# Patient Record
Sex: Female | Born: 1980 | Race: White | Hispanic: No | Marital: Married | Smoking: Never smoker
Health system: Southern US, Community
[De-identification: ages and names within clinical notes are randomized; demographics above are authoritative.]

## PROBLEM LIST (undated history)

## (undated) DIAGNOSIS — K589 Irritable bowel syndrome without diarrhea: Secondary | ICD-10-CM

## (undated) DIAGNOSIS — Z87898 Personal history of other specified conditions: Secondary | ICD-10-CM

## (undated) HISTORY — DX: Personal history of other specified conditions: Z87.898

## (undated) HISTORY — DX: Irritable bowel syndrome, unspecified: K58.9

---

## 1999-03-10 HISTORY — PX: WISDOM TOOTH EXTRACTION: SHX21

## 1999-07-17 ENCOUNTER — Encounter: Admission: RE | Admit: 1999-07-17 | Discharge: 1999-07-17 | Payer: Self-pay | Admitting: Internal Medicine

## 1999-07-17 ENCOUNTER — Encounter: Payer: Self-pay | Admitting: Internal Medicine

## 2008-02-13 ENCOUNTER — Encounter (INDEPENDENT_AMBULATORY_CARE_PROVIDER_SITE_OTHER): Payer: Self-pay | Admitting: Obstetrics and Gynecology

## 2008-02-13 ENCOUNTER — Inpatient Hospital Stay (HOSPITAL_COMMUNITY): Admission: AD | Admit: 2008-02-13 | Discharge: 2008-02-15 | Payer: Self-pay | Admitting: Obstetrics and Gynecology

## 2008-08-14 LAB — HM PAP SMEAR: Pap Smear: NEGATIVE

## 2009-09-12 LAB — HM PAP SMEAR: Pap Smear: NEGATIVE

## 2010-07-22 NOTE — H&P (Signed)
[name], Vazquez              ACCOUNT NO.:  [id number]   MEDICAL RECORD NO.:  [id number]          PATIENT TYPE:  INP   LOCATION:  9167                          FACILITY:  [facility]   PHYSICIAN:  Crist Fat. Rivard, M.D. DATE OF BIRTH:  1980-09-21   DATE OF ADMISSION:  02/13/2008  DATE OF DISCHARGE:                              HISTORY & PHYSICAL   [name] is a 30 year old gravida 2, para 0-0-1-0, at 41 weeks who  presented from the office for induction secondary to oligohydramnios on  ultrasound today.  Patient was seen over the last week to week and a  half with some mildly elevated blood pressures.  PIH labs were within  normal limits last week.  She had a normal NST on Friday.  She was  preferring to decline induction therefore she was scheduled for an  ultrasound today that did show complete anhidrosis with no fluid able to  be evaluated.  BPP was 6/8 with a -2 only for no fluid.  Dopplers were  unable to be done secondary to the inability to clearly visualize the  cord.  There was a cord around the neck noted.  She denied any leaking  or bleeding and reported positive fetal movement.  Cervix in the office  today was 2 to 3, 85% vertex, and a -1 to 0 station.  Pregnancy has been  remarkable for:  1. Post dates.  2. Equivocal rubella.  3. PENICILLIN ALLERGY.  4. First trimester spotting.  5. Mildly elevated blood pressure in the third trimester.  6. Group B strep negative.   PRENATAL LABS:  Blood type is A+.  Rh antibody negative.  VDRL  nonreactive.  Rubella titer is equivocal.  Hepatitis B surface antigen  is negative.  HIV is negative.  First trimester screen was normal.  Cystic fibrosis testing was negative.  GC and chlamydia cultures were  negative in the first trimester.  Pap was normal in April of 2009.  Patient had a normal AFP.  Hemoglobin upon entering the practice was  12.6.  Hemoglobin was 11.4 at 28 weeks.  Glucola was normal.  Group B  strep culture and other  cultures were negative at 36 weeks.   HISTORY OF PRESENT PREGNANCY:  Patient entered care as a transfer from  [facility].  She entered care at [facility] at 24 weeks.  She was hoping for an unmedicated, uninterventive birth.  There was a  note that all her listed allergies were only GI issues except for the  penicillin which was a childhood allergy.  She had a normal Glucola.  Hemoglobin was 11.4 at 28 weeks.  She had a small hemorrhoid noted at 30  weeks.  She declined the H1N1 vaccine.  Group B strep culture was  negative at [redacted] weeks along with other cultures.  She took Elige Radon  classes during her pregnancy.  She had developed a birth plan at 39  weeks.  At 40-2/7 weeks, blood pressure was 124/88, repeat was 120/80.  Cervix was fingertip, 60%, vertex -1.  She was placed on bedrest that  day.  PIH labs  were done.  She had an NST on February 10, 2008, that was  reactive and PIH labs were normal.  At 40-4/7 weeks, she had 1+ protein  on a voided specimen but cath specimen was negative.  She declined  induction of labor.  She was having no PIH symptoms.  She was scheduled  for an ultrasound today.  She was then seen today for ultrasound which  the anhidrosis was noted and cervix was noted be favorable at 2 to 3 cm.   OBSTETRICAL HISTORY:  In 2009, she had a first trimester miscarriage at  6 to 8 weeks which resolved spontaneously.  She is a previous condom  user and oral contraceptive user.  She had an abnormal Pap in 1997.  Repeat Pap was done.  Last one was done in May 2009 and was normal at  [facility].  She was treated for chlamydia in 1995.  She has  frequent UTIs and yeast infections.  She reports usual childhood  illnesses.  She has history of questionable IBS-type symptoms but no  problems currently during her pregnancy.  Surgical history includes  wisdom teeth removed in 2001.   ALLERGIES:  1. PENICILLIN WHICH HAS AN UNKNOWN TYPE OF ALLERGY AS A CHILD.  2.  Z-PAK, CODEINE, AND MYCINS ALL CAUSE NAUSEA AND VOMITING.   FAMILY HISTORY:  Her paternal grandfather had heart disease.  Maternal  grandmother and maternal grandfather had chronic hypertension.  Maternal  grandmother had a stroke.  Paternal grandfather had stomach and lung  cancer.  Maternal grandfather had colon cancer.   GENETIC HISTORY:  Unremarkable.   SOCIAL HISTORY:  Patient is married to the father of the baby.  He is  involved and supportive.  His name is Alyssa Vazquez.  Patient is  Caucasian.  She denies a religious affiliation.  She has a bachelor's  degree.  She is employed in social work.  Her husband has a bachelor's  degree.  He is a Production designer, theatre/television/film.  She has been followed by the certified nurse  midwife service at [facility].  She denies any alcohol, drug,  or tobacco use during this pregnancy.   PHYSICAL EXAM:  Blood pressure initially was 140/90 in the office, here  it is 135/78.  Other vital signs are stable.  HEENT:  Within normal limits.  LUNGS:  The breath sounds are clear.  HEART:  Regular rhythm without murmur.  BREASTS:  Soft, nontender.  ABDOMEN:  Fundal height is approximately 38 cm.  Estimated fetal weight  is 77-1/2 pounds.  Uterine contractions are very occasional and mild.  Fetal heart rate is overall reactive.  There is some very occasional  mild variables noted.  Deep tendon reflexes are 2+ without clonus.  There is a trace edema noted.  Cervix as previously noted was to 2 to 3,  85% vertex, -1 to 0 station in the office.   IMPRESSION:  1. Intrauterine pregnancy at 41 weeks.  2. Oligohydramnios.  3. Favorable cervix.  4. Post dates.  5. Mild labile blood pressures.   PLAN:  1. Admit to birthing suite, consult Dr. Estanislado Pandy who is attending      physician.  2. Routine certified nurse midwife orders.  3. Plan Pitocin induction and artificial rupture of membranes as      appropriate.  Support was offered to the patient for these changes      in  her anticipated birth plan in light of fetal status.  Support  was offered to the patient regarding our willingness to help her      accomplish the components of her birth plan that may still be      accomplishable despite the risk situation.  4. Close observation of maternal fetal status.  5. We will check PIH labs today.      Renaldo Reel Emilee Hero, C.N.M.      Crist Fat Rivard, M.D.  Electronically Signed    VLL/MEDQ  D:  02/13/2008  T:  02/13/2008  Job:  [id number]

## 2010-07-22 NOTE — H&P (Signed)
Alyssa Vazquez, Alyssa Vazquez              ACCOUNT NO.:  [id number]   MEDICAL RECORD NO.:  [id number]          PATIENT TYPE:  INP   LOCATION:  9125                          FACILITY:  [facility]   PHYSICIAN:  Crist Fat. Rivard, M.D. DATE OF BIRTH:  1981/01/28   DATE OF ADMISSION:  02/13/2008  DATE OF DISCHARGE:                              HISTORY & PHYSICAL   This is a 30 year old gravida 2, para 0-0-1-0 at 41 weeks who presents  to the office with postdates.  She had an ultrasound at the office,  which showed no fluid anywhere in the uterus and the growth of the baby  was 70.2 percentile.  Fetal heart rate was 144, and the placenta was  grade 3.  Doppler studies were unable to be obtained due to absence of  amniotic fluid.  She is being sent to the hospital for induction of  labor.  Pregnancy has been followed by the Nurse-Midwife service and  remarkable for equivocal rubella, first trimester spotting, penicillin  allergy, and IBS.   ALLERGIES:  PENICILLIN, Z-PAK, CODEINE, and MYCIN DRUGS.   OB HISTORY:  Remarkable for a spontaneous abortion at 6th week in 2009.   MEDICAL HISTORY:  Remarkable for abnormal Pap in 1997, Chlamydia in  1995, childhood varicella and irritable bowel syndrome.   SURGICAL HISTORY:  Remarkable for wisdom teeth in 2001.   FAMILY HISTORY:  Remarkable for grandmother with heart disease and  hypertension.  Grandmother with unknown type of neurologic problem and  grandmother and grandfather with cancer.   GENETIC HISTORY:  Negative.   SOCIAL HISTORY:  The patient is married to [employer], who is  involved and supportive.  She is a Child psychotherapist.  She denies any  alcohol, tobacco, or drug use.   PRENATAL LABS:  Hemoglobin 12.2, platelets 213, blood type A positive.  Antibody screen negative.  Rubella equivocal.  Hepatitis negative.  HIV  negative.  Pap test normal.  Gonorrhea negative.  Chlamydia negative.  Cystic fibrosis negative.   HISTORY OF CURRENT PREGNANCY:   The patient entered care at 24 weeks as a  transfer from [facility].  She had Glucola at 28 weeks that was  normal, treated for hemorrhoids at 30 weeks.  Group B strep was negative  at term.  She did take Elige Radon classes and then she presented today with  oligohydramnios.  She is very reluctant to pursue induction of labor but  agrees to proceed.   OBJECTIVE DATA:  VITAL SIGNS:  Stable, afebrile.  HEENT:  Within normal limits. Thyroid normal, not enlarged.  CHEST:  Clear to auscultation.  HEART:  Heart rate, regular rate and rhythm.  ABDOMEN:  Gravid.  Fetal heart rate is 144.  PELVIC:  Cervix is 2-3, 85 -1 station with vertex presentation.  EXTREMITIES:  Within normal limits.   Ultrasound shows growth at 70.2%.  Oligohydramnios with no fluid seen.  Placenta is posterior grade 3, and BPP is 6/8 with zero points for  fluid.   ASSESSMENT:  1. Intrauterine pregnancy at 41 weeks.  2. Oligohydramnios, severe.   PLAN:  Admit to [facility]  for induction of labor.  Risks and  benefits reviewed.  The patient is reluctant to pursue induction because  of desire to complete a Elige Radon method birth.  However, reassurances  were made and risks of not inducing labor were reviewed and the patient  agrees to proceed and further orders to follow.      Marie L. Williams, C.N.M.      Crist Fat Rivard, M.D.  Electronically Signed    MLW/MEDQ  D:  02/13/2008  T:  02/14/2008  Job:  [id number]

## 2010-09-12 ENCOUNTER — Inpatient Hospital Stay (HOSPITAL_COMMUNITY)
Admission: AD | Admit: 2010-09-12 | Discharge: 2010-09-13 | DRG: 775 | Disposition: A | Discharge status: Home / Self Care | Payer: 59 | Source: Ambulatory Visit | Attending: Obstetrics and Gynecology | Admitting: Obstetrics and Gynecology

## 2010-09-12 DIAGNOSIS — Z3490 Encounter for supervision of normal pregnancy, unspecified, unspecified trimester: Secondary | ICD-10-CM

## 2010-09-12 DIAGNOSIS — O480 Post-term pregnancy: Principal | ICD-10-CM | POA: Diagnosis present

## 2010-09-12 MED ORDER — OXYTOCIN 20 UNITS IN LACTATED RINGERS INFUSION - SIMPLE: 125.0000 mL/h | INTRAVENOUS | Status: DC | PRN

## 2010-09-12 MED ORDER — IBUPROFEN 600 MG PO TABS
600.0000 mg | ORAL_TABLET | Freq: Four times a day (QID) | ORAL | Status: DC
  Administered 2010-09-13 (×2): 600 mg via ORAL
  Filled 2010-09-12 (×3): qty 1

## 2010-09-12 MED ORDER — ONDANSETRON HCL 4 MG PO TABS: 4.0000 mg | ORAL_TABLET | ORAL | Status: DC | PRN

## 2010-09-12 MED ORDER — METHYLERGONOVINE MALEATE 0.2 MG/ML IJ SOLN: 0.2000 mg | Freq: Four times a day (QID) | INTRAMUSCULAR | Status: DC | PRN

## 2010-09-12 MED ORDER — METHYLERGONOVINE MALEATE 0.2 MG PO TABS: 0.2000 mg | ORAL_TABLET | ORAL | Status: DC | PRN

## 2010-09-12 MED ORDER — OXYCODONE-ACETAMINOPHEN 5-325 MG PO TABS
1.0000 | ORAL_TABLET | ORAL | Status: DC | PRN
  Administered 2010-09-13 (×2): 1 via ORAL
  Filled 2010-09-12 (×2): qty 1

## 2010-09-12 MED ORDER — BISACODYL 10 MG RE SUPP: 10.0000 mg | Freq: Every day | RECTAL | Status: DC | PRN

## 2010-09-12 MED ORDER — OXYTOCIN 20 UNITS IN LACTATED RINGERS INFUSION - SIMPLE: 125.0000 mL/h | INTRAVENOUS | Status: DC

## 2010-09-12 MED ORDER — DIPHENHYDRAMINE HCL 25 MG PO CAPS: 25.0000 mg | ORAL_CAPSULE | Freq: Four times a day (QID) | ORAL | Status: DC | PRN

## 2010-09-12 MED ORDER — FERROUS SULFATE 325 (65 FE) MG PO TABS
325.0000 mg | ORAL_TABLET | Freq: Two times a day (BID) | ORAL | Status: DC
  Administered 2010-09-13: 325 mg via ORAL

## 2010-09-12 MED ORDER — MAGNESIUM HYDROXIDE 400 MG/5ML PO SUSP: 30.0000 mL | ORAL | Status: DC | PRN

## 2010-09-12 MED ORDER — SENNOSIDES-DOCUSATE SODIUM 8.6-50 MG PO TABS
1.0000 | ORAL_TABLET | Freq: Every evening | ORAL | Status: DC | PRN
  Filled 2010-09-12: qty 2

## 2010-09-12 MED ORDER — WITCH HAZEL-GLYCERIN EX PADS
MEDICATED_PAD | CUTANEOUS | Status: DC | PRN
  Filled 2010-09-12: qty 100

## 2010-09-12 MED ORDER — ZOLPIDEM TARTRATE 5 MG PO TABS: 5.0000 mg | ORAL_TABLET | Freq: Every evening | ORAL | Status: DC | PRN

## 2010-09-12 MED ORDER — PSEUDOEPHEDRINE HCL 30 MG PO TABS: 60.0000 mg | ORAL_TABLET | ORAL | Status: DC | PRN

## 2010-09-12 MED ORDER — MENTHOL 3 MG MT LOZG: 1.0000 | LOZENGE | OROMUCOSAL | Status: DC | PRN

## 2010-09-12 MED ORDER — SODIUM CHLORIDE 0.9 % IJ SOLN
3.0000 mL | Freq: Two times a day (BID) | INTRAMUSCULAR | Status: DC
  Filled 2010-09-12 (×3): qty 3

## 2010-09-12 MED ORDER — GUAIFENESIN 100 MG/5ML PO SOLN: 15.0000 mL | ORAL | Status: DC | PRN

## 2010-09-12 MED ORDER — ONDANSETRON HCL 4 MG/2ML IJ SOLN: 4.0000 mg | INTRAMUSCULAR | Status: DC | PRN

## 2010-09-12 MED ORDER — SIMETHICONE 80 MG PO CHEW
80.0000 mg | CHEWABLE_TABLET | ORAL | Status: DC | PRN
  Filled 2010-09-12: qty 1

## 2010-09-12 MED ORDER — CALCIUM CARBONATE ANTACID 500 MG PO CHEW: 1.0000 | CHEWABLE_TABLET | ORAL | Status: DC | PRN

## 2010-09-12 MED ORDER — BENZOCAINE-MENTHOL 20-0.5 % EX AERO: 1.0000 "application " | INHALATION_SPRAY | CUTANEOUS | Status: DC | PRN

## 2010-09-13 DIAGNOSIS — Z3490 Encounter for supervision of normal pregnancy, unspecified, unspecified trimester: Secondary | ICD-10-CM

## 2010-09-13 LAB — CBC
HCT: 34.7 % — ABNORMAL LOW (ref 36.0–46.0)
Hemoglobin: 11.5 g/dL — ABNORMAL LOW (ref 12.0–15.0)
RBC: 3.98 MIL/uL (ref 3.87–5.11)
WBC: 14.1 10*3/uL — ABNORMAL HIGH (ref 4.0–10.5)

## 2010-09-13 MED ORDER — IBUPROFEN 600 MG PO TABS: 600.0000 mg | ORAL_TABLET | Freq: Four times a day (QID) | ORAL | Status: AC

## 2010-09-13 MED ORDER — OXYCODONE-ACETAMINOPHEN 5-325 MG PO TABS: 1.0000 | ORAL_TABLET | ORAL | Status: AC | PRN

## 2010-09-13 NOTE — Discharge Summary (Signed)
Obstetric Discharge Summary Reason for Admission: labor Prenatal Procedures: none Intrapartum Procedures: spontaneous vaginal delivery Postpartum Procedures: none Complications-Operative and Postpartum: none  Hemoglobin  Date Value Range Status  09/13/2010 11.5* 12.0-15.0 (g/dL) Final     HCT  Date Value Range Status  09/13/2010 34.7* 36.0-46.0 (%) Final    Discharge Diagnoses: Term Pregnancy-delivered  Discharge Information: Date: 09/13/2010 Activity: pelvic rest Diet: routine Medications: PNV, Ibuprophen and Percocet Condition: stable Instructions: refer to practice specific booklet Discharge to: home Follow-up Information    Follow up with Arlenis Blaydes A. Make an appointment in 6 weeks.   Contact information:   [address], [address] [place] [address] [phone]          Newborn Data: Live born  Information for the patient's newborn:  Amiri, Tritch [[id number]]  female ; APGAR , ; weight ;  Home with mother.  LILLARD,SHELLEY M 09/13/2010, 12:56 PM

## 2010-09-13 NOTE — Progress Notes (Deleted)
Obstetric Discharge Summary Reason for Admission: onset of labor Prenatal Procedures: none Intrapartum Procedures: spontaneous vaginal delivery Postpartum Procedures: none Complications-Operative and Postpartum: none  Hemoglobin  Date Value Range Status  09/13/2010 11.5* 12.0-15.0 (g/dL) Final     HCT  Date Value Range Status  09/13/2010 34.7* 36.0-46.0 (%) Final    Discharge Diagnoses: Term Pregnancy-delivered  Discharge Information: Date: 09/13/2010 Activity: pelvic rest Diet: routine Medications: PNV, Ibuprophen and Percocet Condition: stable Instructions: refer to practice specific booklet Discharge to: home Follow-up Information    Follow up with RIVARD,SANDRA A. Make an appointment in 6 weeks.   Contact information:   [address], [address] [place] [address] [phone]          Newborn Data: Live born  Information for the patient's newborn:  Alyssa Vazquez, Alyssa Vazquez [[id number]]  female ; APGAR , ; weight ;  Home with mother.  Alyssa Vazquez 09/13/2010, 12:50 PM

## 2010-09-13 NOTE — Progress Notes (Signed)
Pt discharged at 2000 

## 2010-09-13 NOTE — Progress Notes (Signed)
  Post Partum Day 1 Subjective: no complaints, up ad lib, voiding and tolerating PO  Objective: Blood pressure 113/73, pulse 85, temperature 97.8 F (36.6 C), resp. rate 20, height 5\' 3"  (1.6 m), weight 87.544 kg (193 lb).  Physical Exam:  General: alert and no distress Lochia: appropriate Uterine Fundus: firm Incision: none DVT Evaluation: No evidence of DVT seen on physical exam.   Basename 09/13/10 0520  HGB 11.5*  HCT 34.7*    Assessment/Plan: Discharge home, Breastfeeding, Circumcision prior to discharge and Contraception condoms Pt desires early DC, after 1800.     LOS: 1 day   Guenther Dunshee M 09/13/2010, 12:52 PM

## 2010-12-12 LAB — CBC
HCT: 36 % (ref 36.0–46.0)
Hemoglobin: 11.6 g/dL — ABNORMAL LOW (ref 12.0–15.0)
MCHC: 33.5 g/dL (ref 30.0–36.0)
MCV: 89.3 fL (ref 78.0–100.0)
Platelets: 170 10*3/uL (ref 150–400)
RBC: 3.72 MIL/uL — ABNORMAL LOW (ref 3.87–5.11)
RDW: 16.5 % — ABNORMAL HIGH (ref 11.5–15.5)
RDW: 17 % — ABNORMAL HIGH (ref 11.5–15.5)
WBC: 15.5 10*3/uL — ABNORMAL HIGH (ref 4.0–10.5)

## 2010-12-12 LAB — COMPREHENSIVE METABOLIC PANEL
ALT: 17 U/L (ref 0–35)
Albumin: 2.8 g/dL — ABNORMAL LOW (ref 3.5–5.2)
Alkaline Phosphatase: 130 U/L — ABNORMAL HIGH (ref 39–117)
BUN: 8 mg/dL (ref 6–23)
Calcium: 8.9 mg/dL (ref 8.4–10.5)
Chloride: 109 mEq/L (ref 96–112)
Creatinine, Ser: 0.58 mg/dL (ref 0.4–1.2)
GFR calc Af Amer: >60 mL/min (ref >60)
Glucose, Bld: 102 mg/dL — ABNORMAL HIGH (ref 70–99)
Potassium: 3.8 mEq/L (ref 3.5–5.1)
Sodium: 140 mEq/L (ref 135–145)
Total Bilirubin: 0.6 mg/dL (ref 0.3–1.2)
Total Protein: 5.1 g/dL — ABNORMAL LOW (ref 6.0–8.3)
Total Protein: 6 g/dL (ref 6.0–8.3)

## 2010-12-12 LAB — LACTATE DEHYDROGENASE: LDH: 171 U/L (ref 94–250)

## 2010-12-12 LAB — RPR: RPR Ser Ql: NONREACTIVE

## 2010-12-12 LAB — URIC ACID: Uric Acid, Serum: 5.5 mg/dL (ref 2.4–7.0)

## 2012-01-01 ENCOUNTER — Telehealth: Payer: Self-pay | Admitting: Obstetrics and Gynecology

## 2012-01-04 ENCOUNTER — Encounter: Payer: PRIVATE HEALTH INSURANCE | Admitting: Obstetrics and Gynecology

## 2012-02-17 ENCOUNTER — Ambulatory Visit (INDEPENDENT_AMBULATORY_CARE_PROVIDER_SITE_OTHER): Payer: PRIVATE HEALTH INSURANCE

## 2012-02-17 VITALS — BP 100/68 | Resp 14 | Wt 158.0 lb

## 2012-02-17 DIAGNOSIS — Z8742 Personal history of other diseases of the female genital tract: Secondary | ICD-10-CM

## 2012-02-17 DIAGNOSIS — Z87898 Personal history of other specified conditions: Secondary | ICD-10-CM

## 2012-02-17 DIAGNOSIS — Z124 Encounter for screening for malignant neoplasm of cervix: Secondary | ICD-10-CM

## 2012-02-17 DIAGNOSIS — Z8719 Personal history of other diseases of the digestive system: Secondary | ICD-10-CM

## 2012-02-17 HISTORY — DX: Personal history of other specified conditions: Z87.898

## 2012-02-17 NOTE — Progress Notes (Signed)
The patient reports:she complains of ParaGard causing irregular menses; however, she doesn't want it out. Contraception:IUD  Last mammogram: Never  Last pap: October 24, 2010 WNL  GC/Chlamydia cultures offered: declined HIV/RPR/HbsAg offered:  declined HSV 1 and 2 glycoprotein offered: declined  Menstrual cycle regular and monthly: no Menstrual flow normal: Yes  Urinary symptoms: none Normal bowel movements: Yes Reports abuse at home: No

## 2012-02-18 LAB — PAP IG W/ RFLX HPV ASCU

## 2012-10-31 NOTE — Progress Notes (Signed)
.. Subjective:    Alyssa Vazquez is a 32 y.o. MW female, P2012, who presents for an annual exam.   The patient reports:she complains of ParaGard causing irregular menses; however, she doesn't want it out. Contraception:IUD  Last mammogram: Never  Last pap: October 24, 2010 WNL  GC/Chlamydia cultures offered: declined  HIV/RPR/HbsAg offered: declined  HSV 1 and 2 glycoprotein offered: declined  Menstrual cycle regular and monthly: no  Menstrual flow normal: Yes  Urinary symptoms: none  Normal bowel movements: Yes  Reports abuse at home: No   History   Social History  . Marital Status: Married    Spouse Name: N/A    Number of Children: N/A  . Years of Education: N/A   Social History Main Topics  . Smoking status: Never Smoker   . Smokeless tobacco: Never Used  . Alcohol Use: 0.5 oz/week    1 drink(s) per week  . Drug Use: No  . Sexual Activity: Yes    Partners: Male    Birth Control/ Protection: IUD     Comment: Paraguard    Other Topics Concern  . None   Social History Narrative  . None   Menstrual cycle:   LMP: Patient's last menstrual period was 02/06/2012. .. Past Medical History  Diagnosis Date  . IBS (irritable bowel syndrome)   . History of abnormal Pap smear 02/17/2012    Abnormal in 1997; repeats have been normal  .. Past Surgical History  Procedure Laterality Date  . Wisdom tooth extraction  2001  .Marland Kitchen Allergies  Allergen Reactions  . Codeine Nausea And Vomiting  . Erythromycin   . Penicillins   . Zithromax [Azithromycin] Nausea Only              The following portions of the patient's history were reviewed and updated as appropriate: allergies, current medications, past family history, past medical history, past social history, past surgical history and problem list.  Review of Systems Pertinent items are noted in HPI. Breast:Negative for breast lump,nipple discharge or nipple retraction Gastrointestinal: Negative for abdominal pain, change  in bowel habits or rectal bleeding Urinary:negative   Objective:    BP 100/68  Resp 14  Wt 158 lb (71.668 kg)  BMI 28 kg/m2  LMP 02/06/2012    Weight:  Wt Readings from Last 1 Encounters:  02/17/12 158 lb (71.668 kg)          BMI: Body mass index is 28 kg/(m^2).  General Appearance: Alert, appropriate appearance for age. No acute distress HEENT: Grossly normal Neck / Thyroid: Supple, no masses, nodes or enlargement Lungs: clear to auscultation bilaterally Back: No CVA tenderness Breast Exam: No dimpling, nipple retraction or discharge. No masses or nodes. and No masses or nodes.No dimpling, nipple retraction or discharge. Cardiovascular: Regular rate and rhythm. S1, S2, no murmur Gastrointestinal: Soft, non-tender, no masses or organomegaly Pelvic Exam: Vulva and vagina appear normal. Bimanual exam reveals normal uterus and adnexa. Paraguard strings visualized. Rectovaginal: not indicated Lymphatic Exam: Non-palpable nodes in neck, clavicular, axillary,  Skin: no rash or abnormalities Neurologic: Normal gait and speech, no tremor  Psychiatric: Alert and oriented, appropriate affect.   Wet Prep:not applicable Urinalysis:not applicable UPT: Not done   Assessment:    Normal 31y.o. gyn exam   irregular menses w/ paraguard-but doesn't desire change in contraception H/o abnl pap IBS Plan:    Mammogram-due age 13 pap smear done, next pap due 2014-2015 return annually or prn STD screening: declined Contraception:IUD--paraguard  C. Skeet Simmer  Alyssa Vazquez, CNM Late entry from 02/17/12

## 2013-01-17 ENCOUNTER — Ambulatory Visit: Payer: PRIVATE HEALTH INSURANCE | Attending: Internal Medicine | Admitting: Physical Therapy

## 2013-01-17 DIAGNOSIS — M2420 Disorder of ligament, unspecified site: Secondary | ICD-10-CM | POA: Insufficient documentation

## 2013-01-17 DIAGNOSIS — M629 Disorder of muscle, unspecified: Secondary | ICD-10-CM | POA: Insufficient documentation

## 2013-01-17 DIAGNOSIS — IMO0001 Reserved for inherently not codable concepts without codable children: Secondary | ICD-10-CM | POA: Insufficient documentation

## 2013-01-25 ENCOUNTER — Ambulatory Visit: Payer: PRIVATE HEALTH INSURANCE | Admitting: Physical Therapy

## 2013-02-07 ENCOUNTER — Ambulatory Visit: Payer: PRIVATE HEALTH INSURANCE | Attending: Internal Medicine | Admitting: Physical Therapy

## 2013-02-07 DIAGNOSIS — M2420 Disorder of ligament, unspecified site: Secondary | ICD-10-CM | POA: Insufficient documentation

## 2013-02-07 DIAGNOSIS — IMO0001 Reserved for inherently not codable concepts without codable children: Secondary | ICD-10-CM | POA: Insufficient documentation

## 2013-02-07 DIAGNOSIS — M629 Disorder of muscle, unspecified: Secondary | ICD-10-CM | POA: Insufficient documentation

## 2013-02-13 ENCOUNTER — Ambulatory Visit: Payer: PRIVATE HEALTH INSURANCE | Admitting: Physical Therapy

## 2013-02-20 ENCOUNTER — Ambulatory Visit: Payer: PRIVATE HEALTH INSURANCE | Admitting: Physical Therapy

## 2013-02-22 ENCOUNTER — Encounter: Payer: PRIVATE HEALTH INSURANCE | Admitting: Physical Therapy

## 2013-05-08 ENCOUNTER — Ambulatory Visit
Admission: RE | Admit: 2013-05-08 | Discharge: 2013-05-08 | Disposition: A | Discharge status: Home / Self Care | Payer: PRIVATE HEALTH INSURANCE | Source: Ambulatory Visit | Attending: Internal Medicine | Admitting: Internal Medicine

## 2013-05-08 ENCOUNTER — Other Ambulatory Visit: Payer: Self-pay | Admitting: Internal Medicine

## 2013-05-08 DIAGNOSIS — R52 Pain, unspecified: Secondary | ICD-10-CM

## 2013-05-17 ENCOUNTER — Ambulatory Visit: Payer: PRIVATE HEALTH INSURANCE

## 2013-09-29 ENCOUNTER — Other Ambulatory Visit: Payer: Self-pay | Admitting: Obstetrics and Gynecology

## 2013-09-29 DIAGNOSIS — N6001 Solitary cyst of right breast: Secondary | ICD-10-CM

## 2013-09-29 DIAGNOSIS — R922 Inconclusive mammogram: Secondary | ICD-10-CM

## 2013-10-05 ENCOUNTER — Ambulatory Visit
Admission: RE | Admit: 2013-10-05 | Discharge: 2013-10-05 | Disposition: A | Discharge status: Home / Self Care | Payer: PRIVATE HEALTH INSURANCE | Source: Ambulatory Visit | Attending: Obstetrics and Gynecology | Admitting: Obstetrics and Gynecology

## 2013-10-05 ENCOUNTER — Other Ambulatory Visit: Payer: Self-pay | Admitting: Obstetrics and Gynecology

## 2013-10-05 DIAGNOSIS — N644 Mastodynia: Secondary | ICD-10-CM

## 2013-10-05 DIAGNOSIS — N6001 Solitary cyst of right breast: Secondary | ICD-10-CM

## 2014-10-02 ENCOUNTER — Ambulatory Visit: Payer: PRIVATE HEALTH INSURANCE | Admitting: Physical Therapy

## 2014-10-22 ENCOUNTER — Ambulatory Visit: Payer: PRIVATE HEALTH INSURANCE | Admitting: Physical Therapy

## 2015-03-28 ENCOUNTER — Other Ambulatory Visit: Payer: Self-pay | Admitting: Internal Medicine

## 2015-03-28 DIAGNOSIS — R945 Abnormal results of liver function studies: Secondary | ICD-10-CM

## 2015-03-28 DIAGNOSIS — R7989 Other specified abnormal findings of blood chemistry: Secondary | ICD-10-CM

## 2015-03-28 DIAGNOSIS — R112 Nausea with vomiting, unspecified: Secondary | ICD-10-CM

## 2015-04-04 ENCOUNTER — Ambulatory Visit
Admission: RE | Admit: 2015-04-04 | Discharge: 2015-04-04 | Disposition: A | Discharge status: Home / Self Care | Payer: PRIVATE HEALTH INSURANCE | Source: Ambulatory Visit | Attending: Internal Medicine | Admitting: Internal Medicine

## 2015-04-04 DIAGNOSIS — R112 Nausea with vomiting, unspecified: Secondary | ICD-10-CM

## 2015-04-04 DIAGNOSIS — R945 Abnormal results of liver function studies: Secondary | ICD-10-CM

## 2015-04-04 DIAGNOSIS — R7989 Other specified abnormal findings of blood chemistry: Secondary | ICD-10-CM

## 2015-11-27 ENCOUNTER — Ambulatory Visit: Payer: PRIVATE HEALTH INSURANCE | Attending: Internal Medicine | Admitting: Physical Therapy

## 2015-11-27 ENCOUNTER — Encounter: Payer: Self-pay | Admitting: Physical Therapy

## 2015-11-27 DIAGNOSIS — R279 Unspecified lack of coordination: Secondary | ICD-10-CM | POA: Diagnosis present

## 2015-11-27 DIAGNOSIS — M6281 Muscle weakness (generalized): Secondary | ICD-10-CM | POA: Diagnosis not present

## 2015-11-27 NOTE — Patient Instructions (Signed)
Lower abdominal/core stability exercises  1. Practice your breathing technique: Inhale through your nose expanding your belly and rib cage. Try not to breathe into your chest. Exhale slowly and gradually out your mouth feeling a sense of softness to your body. Practice multiple times. This can be performed unlimited.  2. Finding the lower abdominals. Laying on your back with the knees bent, place your fingers just below your belly button. Using your breathing technique from above, on your exhale gently pull the belly button away from your fingertips without tensing any other muscles. Practice this 5x. Next, as you exhale, draw belly button inwards and hold onto it...then feel as if you are pulling that muscle across your pelvis like you are tightening a belt. This can be hard to do at first so be patient and practice. Do 5-10 reps 1-3 x day. Always recognize quality over quantity; if your abdominal muscles become tired you will notice you may tighten/contract other muscles. This is the time to take a break.   Practice this first laying on your back, then in sitting, progressing to standing and finally adding it to all your daily movements.   3. Finding your pelvic floor. Using the breathing technique above, when your exhale, this time draw your pelvic floor muscles up as if you were attempting to stop the flow of urination. Be careful NOT to tense any other muscles. This can be hard, BE PATIENT. Try to hold up to 10 seconds repeating 10x. Try 2x a day. Once you feel you are doing this well, add this contraction to exercise #2. First contracting your pelvic floor followed by lower abdominals.   4. Adding leg movements. Add the following leg movements to challenge your ability to keep your core stable:  1. Single leg drop outs: Laying on your back with knees bent feet flat. Inhale,  dropping one knee outward KEEPING YOUR PELVIS STILL. Exhale as you bring the leg back, simultaneously performing your lower  abdominal contraction. Do 5-10 on each leg.   2. Marching: While keeping your pelvis still, lift the right foot a few inches, put it down then lift left foot. This will mimic a march. Start slow to establish control. Once you have control you may speed it up. Do 10-20x. You MUST keep your lower abdominlas contracted while you march. Breathe naturally    3. Single leg slides: Inhale while you slowly slide one leg out keeping your pelvis still. Only slide your leg as far as you can keep your pelvis still. Exhale as you bring the leg back to the start, contracting the lower abdominals as you do that. Keep your upper body relaxed. Do 5-10 on each side.    [facility] [address], [address] [place], [place] [address] Phone # [phone] Fax [phone]       
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
## 2015-12-05 ENCOUNTER — Ambulatory Visit: Payer: PRIVATE HEALTH INSURANCE | Admitting: Physical Therapy

## 2015-12-05 ENCOUNTER — Encounter: Payer: Self-pay | Admitting: Physical Therapy

## 2015-12-05 DIAGNOSIS — R279 Unspecified lack of coordination: Secondary | ICD-10-CM

## 2015-12-05 DIAGNOSIS — M6281 Muscle weakness (generalized): Secondary | ICD-10-CM | POA: Diagnosis not present

## 2015-12-05 NOTE — Patient Instructions (Addendum)
Piriformis Stretch, Sitting    Sit, one ankle on opposite knee, same-side hand on crossed knee. Push down on knee, keeping spine straight. Lean torso forward, with flat back, until tension is felt in hamstrings and gluteals of crossed-leg side. Hold _30__ seconds.  Repeat _2__ times per session. Do _1__ sessions per day.  Copyright  VHI. All rights reserved.  Chair Sitting    Sit at edge of seat, spine straight, one leg extended. Put a hand on each thigh and bend forward from the hip, keeping spine straight. Allow hand on extended leg to reach toward toes. Support upper body with other arm. Hold _30__ seconds. Repeat __2_ times per session. Do _1__ sessions per day.  Copyright  VHI. All rights reserved.   Adductor, Sitting    Sit with legs apart. Slide hands forward. Hold _30__ seconds. Repeat _2__ times per session. Do __1_ sessions per day.  Copyright  VHI. All rights reserved.  Quads / HF, Standing    Stand, holding onto chair and grasping one foot with same-side hand. Pull heel toward buttock. Hold _30__ seconds.  Repeat _2__ times per session. Do _1__ sessions per day.  Copyright  VHI. All rights reserved.  Bracing With Knee Fallout (Hook-Lying)    With neutral spine, tighten pelvic floor and abdominals and hold. Alternating legs, drop knee out to side. Keep opposite hip still. Repeat 10___ times. Do _1__ times a day.   Copyright  VHI. All rights reserved.  [facility] [address], [address] [place], [place] [address] Phone # [phone] Fax [phone]

## 2015-12-05 NOTE — Therapy (Signed)
[facility] [address], [place], [place], [address] Phone: [phone]   Fax:  [phone]  Physical Therapy Treatment  Patient Details  Name: Alyssa Vazquez MRN: [id number] Date of Birth: 1980/09/14 Referring Provider: Dr. Jilda Panda  Encounter Date: 12/05/2015      PT End of Session - 12/05/15 1237    Visit Number 2   Date for PT Re-Evaluation 01/22/16   PT Start Time 1236  patient came late   PT Stop Time 1315   PT Time Calculation (min) 39 min   Activity Tolerance Patient tolerated treatment well   Behavior During Therapy [facility] for tasks assessed/performed      Past Medical History:  Diagnosis Date  . History of abnormal Pap smear 02/17/2012   Abnormal in 1997; repeats have been normal  . IBS (irritable bowel syndrome)     Past Surgical History:  Procedure Laterality Date  . WISDOM TOOTH EXTRACTION  2001    There were no vitals filed for this visit.      Subjective Assessment - 12/05/15 1237    Subjective I have not had any accidents since last visit.  Patient finds herself think about the pelvic floor contraction when laughing.    Patient Stated Goals No longer feel like she has to urinate; no urinary leakage; rebuild core strength   Currently in Pain? No/denies                         [facility] Adult PT Treatment/Exercise - 12/05/15 0001      Exercises   Exercises Lumbar     Lumbar Exercises: Supine   Ab Set 2 seconds;20 reps  tactile cues to increase lower abdominal contraction     Manual Therapy   Manual Therapy Joint mobilization;Soft tissue mobilization;Muscle Energy Technique   Manual therapy comments manually stretched bil. piriformis and posterior hip capsule   Joint Mobilization P-A rotational mobilizaton grade 3; sacral mobilization for rotational movement   Soft tissue mobilization bil. levator ani in prone                PT Education - 12/05/15 1304     Education provided Yes   Education Details core strength; stretches   Person(s) Educated Patient   Methods Explanation;Demonstration;Handout;Tactile cues   Comprehension Returned demonstration;Verbalized understanding          PT Short Term Goals - 12/05/15 1311      PT SHORT TERM GOAL #1   Title independent with initial HEP   Time 4   Period Weeks   Status On-going     PT SHORT TERM GOAL #2   Title urinary leakage with laughing decreased >/= 25%   Time 4   Period Weeks   Status On-going           PT Long Term Goals - 11/27/15 1528      PT LONG TERM GOAL #1   Title independent with HEP   Time 8   Period Weeks   Status New     PT LONG TERM GOAL #2   Title urinary leakage with laughing decreased >/= 80%   Time 8   Period Weeks   Status New     PT LONG TERM GOAL #3   Title abdominal strength >/= 4/5 so she is able to do daily tasks with greater ease   Time 8   Period Weeks   Status New  PT LONG TERM GOAL #4   Title pain in right buttocks decreased >/= 75%   Time 8   Period Weeks   Status New               Plan - 12/05/15 1312    Clinical Impression Statement Patient has not met her goals due to just starting therapy.  Patient has not had urinary leakage since her last visit.  Right ilium is rotated anterior and corrected after visit.  Patient needed many tactile cues to contract abdominals correctly.  Patient had decreased mobility fo the L1-L5. Patient will benefit from skilled therapy to improve strength to reduce urinary leakage.    Rehab Potential Excellent   Clinical Impairments Affecting Rehab Potential None   PT Frequency 1x / week   PT Duration 8 weeks   PT Treatment/Interventions Biofeedback;Electrical Stimulation;Cryotherapy;Ultrasound;Moist Heat;Therapeutic activities;Therapeutic exercise;Neuromuscular re-education;Patient/family education;Manual techniques   PT Next Visit Plan core strengthening with pelvic floor contraction; check  on pelvic alignment; work on abdominal bracing   PT Home Exercise Plan progress as needed   Consulted and Agree with Plan of Care Patient      Patient will benefit from skilled therapeutic intervention in order to improve the following deficits and impairments:  Decreased mobility, Decreased endurance, Increased fascial restricitons, Increased muscle spasms  Visit Diagnosis: Muscle weakness (generalized)  Unspecified lack of coordination     Problem List Patient Active Problem List   Diagnosis Date Noted  . History of abnormal Pap smear 02/17/2012  . History of IBS 02/17/2012  . Normal pregnancy 09/13/2010    Earlie Counts, PT 12/05/15 1:15 PM   [facility] [address], [place], [place], [address] Phone: [phone]   Fax:  [phone]  Name: BLENDA WISECUP MRN: [id number] Date of Birth: May 21, 1980

## 2015-12-12 ENCOUNTER — Encounter: Payer: Self-pay | Admitting: Physical Therapy

## 2015-12-12 ENCOUNTER — Ambulatory Visit: Payer: PRIVATE HEALTH INSURANCE | Attending: Internal Medicine | Admitting: Physical Therapy

## 2015-12-12 DIAGNOSIS — R279 Unspecified lack of coordination: Secondary | ICD-10-CM | POA: Insufficient documentation

## 2015-12-12 DIAGNOSIS — M6281 Muscle weakness (generalized): Secondary | ICD-10-CM | POA: Insufficient documentation

## 2015-12-12 NOTE — Patient Instructions (Addendum)
Quick Contraction: Gravity Resisted (Sitting)    Sitting, quickly squeeze then fully relax pelvic floor. Perform _1__ sets of 5___. Rest for _1__ seconds between sets. Do _3__ times a day.  Copyright  VHI. All rights reserved.  Slow Contraction: Gravity Resisted (Sitting)    Sitting, slowly squeeze pelvic floor for _4_ seconds. Rest for __5_ seconds. Repeat _10__ times. Do _3__ times a day.  Copyright  VHI. All rights reserved.  Cough: Phase 2 (Sitting)    Sit. Squeeze pelvic floor and hold. Inhale. Lean shoulders forward. Exhale. Relax. Repeat _5__ times. Do _3__ times a day.  Copyright  VHI. All rights reserved.  [facility] [address], [address] [place], [place] [address] Phone # [phone] Fax [phone]

## 2015-12-12 NOTE — Therapy (Signed)
[facility] [address], [address] [place], [place], [address] Phone: [phone]   Fax:  [phone]  Physical Therapy Treatment  Patient Details  Name: Alyssa Vazquez MRN: [id number] Date of Birth: Apr 26, 1980 Referring Provider: Dr. Ralene Ok  Encounter Date: 12/12/2015      PT End of Session - 12/12/15 1535    Visit Number 3   Date for PT Re-Evaluation 01/22/16   PT Start Time 1532   PT Stop Time 1610   PT Time Calculation (min) 38 min   Activity Tolerance Patient tolerated treatment well   Behavior During Therapy [facility] for tasks assessed/performed      Past Medical History:  Diagnosis Date  . History of abnormal Pap smear 02/17/2012   Abnormal in 1997; repeats have been normal  . IBS (irritable bowel syndrome)     Past Surgical History:  Procedure Laterality Date  . WISDOM TOOTH EXTRACTION  2001    There were no vitals filed for this visit.      Subjective Assessment - 12/12/15 1533    Subjective I went to the trampoline park. I went to the bathroom 4 times and still leaked. I do not in July.    Patient Stated Goals No longer feel like she has to urinate; no urinary leakage; rebuild core strength   Currently in Pain? No/denies   Multiple Pain Sites No                      Pelvic Floor Special Questions - 12/12/15 0001    Pelvic Floor Internal Exam Patient confirms identification and approves Pt to assess muscle strength   Exam Type Vaginal   Strength strong squeeze, against strong resistance   Strength # of seconds 2           [facility] Adult PT Treatment/Exercise - 12/12/15 0001      Manual Therapy   Manual Therapy Joint mobilization;Internal Pelvic Floor   Joint Mobilization P-A rotational mobilizaton grade 3; sacral mobilization for rotational movement   Internal Pelvic Floor soft tissue work to left puborectalis, ishiococcygeus and left side if urethra                 PT Education - 12/12/15 1605    Education provided Yes   Education Details pelvic floor strength in sitting and beginning of pelvic floor contraction with cough   Person(s) Educated Patient   Methods Demonstration;Explanation;Handout   Comprehension Returned demonstration;Verbalized understanding          PT Short Term Goals - 12/12/15 1534      PT SHORT TERM GOAL #1   Title independent with initial HEP   Time 4   Period Weeks   Status Achieved     PT SHORT TERM GOAL #2   Title urinary leakage with laughing decreased >/= 25%   Time 4   Period Weeks   Status On-going           PT Long Term Goals - 11/27/15 1528      PT LONG TERM GOAL #1   Title independent with HEP   Time 8   Period Weeks   Status New     PT LONG TERM GOAL #2   Title urinary leakage with laughing decreased >/= 80%   Time 8   Period Weeks   Status New     PT LONG TERM GOAL #3   Title abdominal strength >/= 4/5 so she is able  to do daily tasks with greater ease   Time 8   Period Weeks   Status New     PT LONG TERM GOAL #4   Title pain in right buttocks decreased >/= 75%   Time 8   Period Weeks   Status New               Plan - 12/12/15 1606    Clinical Impression Statement Patient leaked urine after continuous jumping on the trampoline and went to the bathroom 4 times.  Pelvic floor strength is 5/5 with 2 second contraction due to decreased endurance.  Patient had tenderness located in left coccygeus, ischiococcygeus and puborectalis.  Patient will benefit from skilled therapy to increase pelvic floor strength and endurance.    Rehab Potential Excellent   Clinical Impairments Affecting Rehab Potential None   PT Frequency 1x / week   PT Duration 8 weeks   PT Treatment/Interventions Biofeedback;Electrical Stimulation;Cryotherapy;Ultrasound;Moist Heat;Therapeutic activities;Therapeutic exercise;Neuromuscular re-education;Patient/family education;Manual techniques   PT Next Visit  Plan work on endurance for pelvic floor with EMG   PT Home Exercise Plan progress as needed   Recommended Other Services None      Patient will benefit from skilled therapeutic intervention in order to improve the following deficits and impairments:  Decreased mobility, Decreased endurance, Increased fascial restricitons, Increased muscle spasms  Visit Diagnosis: Muscle weakness (generalized)  Unspecified lack of coordination     Problem List Patient Active Problem List   Diagnosis Date Noted  . History of abnormal Pap smear 02/17/2012  . History of IBS 02/17/2012  . Normal pregnancy 09/13/2010    Alyssa Vazquez, PT 12/12/15 4:09 PM   [facility] [address] [place], [place], [address] Phone: [phone]   Fax:  [phone]  Name: Alyssa Vazquez MRN: [id number] Date of Birth: 1980-06-18

## 2015-12-19 ENCOUNTER — Encounter: Payer: Self-pay | Admitting: Physical Therapy

## 2015-12-19 ENCOUNTER — Ambulatory Visit: Payer: PRIVATE HEALTH INSURANCE | Admitting: Physical Therapy

## 2015-12-19 DIAGNOSIS — R279 Unspecified lack of coordination: Secondary | ICD-10-CM

## 2015-12-19 DIAGNOSIS — M6281 Muscle weakness (generalized): Secondary | ICD-10-CM

## 2015-12-19 NOTE — Therapy (Signed)
[facility] [address] [place], [place], [address] Phone: [phone]   Fax:  [phone]  Physical Therapy Treatment  Patient Details  Name: Alyssa Vazquez MRN: [id number] Date of Birth: 10-02-1980 Referring Provider: Dr. Ralene Okoy Moreira  Encounter Date: 12/19/2015      PT End of Session - 12/19/15 1603    Visit Number 4   Date for PT Re-Evaluation 01/22/16   PT Start Time 1540  came late   PT Stop Time 1615   PT Time Calculation (min) 35 min   Activity Tolerance Patient tolerated treatment well   Behavior During Therapy [facility] for tasks assessed/performed      Past Medical History:  Diagnosis Date  . History of abnormal Pap smear 02/17/2012   Abnormal in 1997; repeats have been normal  . IBS (irritable bowel syndrome)     Past Surgical History:  Procedure Laterality Date  . WISDOM TOOTH EXTRACTION  2001    There were no vitals filed for this visit.      Subjective Assessment - 12/19/15 1541    Subjective I have not been doing my exercises daily.    Patient Stated Goals No longer feel like she has to urinate; no urinary leakage; rebuild core strength   Currently in Pain? No/denies            [facility] PT Assessment - 12/19/15 0001      Palpation   SI assessment  pelvis in correct alignment                  Pelvic Floor Special Questions - 12/19/15 0001    Exam Type Deferred  on cycle           [facility] Adult PT Treatment/Exercise - 12/19/15 0001      Exercises   Exercises Other Exercises   Other Exercises  sit on green physioball with side to side movement, diagonal movement; pelvic floor contraction hold 10 sec 10 times with pressure of ball by pubic bone then 10 quick flicks     Lumbar Exercises: Seated   Sit to Stand 10 reps  with pelvic floor contraction     Lumbar Exercises: Supine   Ab Set 10 reps;5 seconds  relax cervical muscles   Clam 15 reps;20 reps  left, right  with pelvic floor contraction   Heel Slides 15 reps  left, right, with pelvic floor contraction   Bent Knee Raise 15 reps  right, left, with pelvic floor contraction   Bridge 15 reps  feet on ball; pelvic floor contraction   Large Ball Oblique Isometric 20 reps  with pelvic floor contraction                PT Education - 12/19/15 1602    Education provided No          PT Short Term Goals - 12/19/15 1542      PT SHORT TERM GOAL #1   Title independent with initial HEP   Time 4   Period Weeks     PT SHORT TERM GOAL #2   Title urinary leakage with laughing decreased >/= 25%   Time 4   Period Weeks   Status On-going  no leakage since last visit and not challenged the pelvic floor           PT Long Term Goals - 12/19/15 1543      PT LONG TERM GOAL #1   Title independent with HEP  Time 8   Period Weeks   Status New     PT LONG TERM GOAL #2   Title urinary leakage with laughing decreased >/= 80%   Time 8   Period Weeks   Status New     PT LONG TERM GOAL #3   Title abdominal strength >/= 4/5 so she is able to do daily tasks with greater ease   Time 8   Period Weeks     PT LONG TERM GOAL #4   Title pain in right buttocks decreased >/= 75%   Time 8   Period Weeks   Status On-going               Plan - 12/19/15 1603    Clinical Impression Statement Patient has not leaked since last visit but has not challenged her pelvic floor. Patient has not been consistent with her HEP. Patient has good core control with pelvic floor exercise. Patient will benefit from physical therapy to improve pelvic floor strength.    Rehab Potential Excellent   Clinical Impairments Affecting Rehab Potential None   PT Frequency 1x / week   PT Duration 8 weeks   PT Treatment/Interventions Biofeedback;Electrical Stimulation;Cryotherapy;Ultrasound;Moist Heat;Therapeutic activities;Therapeutic exercise;Neuromuscular re-education;Patient/family education;Manual techniques    PT Next Visit Plan work on endurance for pelvic floor with EMG   PT Home Exercise Plan progress as needed   Consulted and Agree with Plan of Care Patient      Patient will benefit from skilled therapeutic intervention in order to improve the following deficits and impairments:  Decreased mobility, Decreased endurance, Increased fascial restricitons, Increased muscle spasms  Visit Diagnosis: Muscle weakness (generalized)  Unspecified lack of coordination     Problem List Patient Active Problem List   Diagnosis Date Noted  . History of abnormal Pap smear 02/17/2012  . History of IBS 02/17/2012  . Normal pregnancy 09/13/2010    Eulis Foster, PT 12/19/15 4:13 PM   [facility] [address], [address] [place], [place], [address] Phone: [phone]   Fax:  [phone]  Name: Alyssa Vazquez MRN: [id number] Date of Birth: 26-Jul-1980

## 2015-12-26 ENCOUNTER — Encounter: Payer: PRIVATE HEALTH INSURANCE | Admitting: Physical Therapy

## 2016-01-02 ENCOUNTER — Encounter: Payer: PRIVATE HEALTH INSURANCE | Admitting: Physical Therapy

## 2016-01-14 ENCOUNTER — Encounter: Payer: Self-pay | Admitting: Physical Therapy

## 2016-01-14 ENCOUNTER — Ambulatory Visit: Payer: PRIVATE HEALTH INSURANCE | Attending: Internal Medicine | Admitting: Physical Therapy

## 2016-01-14 DIAGNOSIS — M62838 Other muscle spasm: Secondary | ICD-10-CM | POA: Diagnosis present

## 2016-01-14 DIAGNOSIS — R279 Unspecified lack of coordination: Secondary | ICD-10-CM | POA: Insufficient documentation

## 2016-01-14 DIAGNOSIS — M6281 Muscle weakness (generalized): Secondary | ICD-10-CM | POA: Insufficient documentation

## 2016-01-14 NOTE — Patient Instructions (Addendum)
Supine With Rotation    Lie on back with one knee drawn toward chest. Slowly bring bent leg across body until stretch is felt in lower back area. Hold _30__ seconds. Repeat to other side. Repeat _2__ times per session. Do _1__ sessions per day.  Copyright  VHI. All rights reserved.  [facility] [address], [address] [place], [place] [address] Phone # [phone] Fax [phone]

## 2016-01-14 NOTE — Therapy (Addendum)
[facility] [address], [place], [place], [address] Phone: [phone]   Fax:  [phone]  Physical Therapy Treatment  Patient Details  Name: Alyssa Vazquez MRN: [id number] Date of Birth: 1980-10-03 Referring Provider: Dr. Jilda Panda  Encounter Date: 01/14/2016      PT End of Session - 01/14/16 0923    Visit Number 5   Date for PT Re-Evaluation 03/04/16   PT Start Time 0845   PT Stop Time 0923   PT Time Calculation (min) 38 min   Activity Tolerance Patient tolerated treatment well   Behavior During Therapy [facility] for tasks assessed/performed      Past Medical History:  Diagnosis Date  . History of abnormal Pap smear 02/17/2012   Abnormal in 1997; repeats have been normal  . IBS (irritable bowel syndrome)     Past Surgical History:  Procedure Laterality Date  . WISDOM TOOTH EXTRACTION  2001    There were no vitals filed for this visit.      Subjective Assessment - 01/14/16 0850    Subjective I was tickled and no leakage. I have some discomfort in left backside to left tailbone that is sporadic.    Patient Stated Goals No longer feel like she has to urinate; no urinary leakage; rebuild core strength   Currently in Pain? Yes   Pain Score 3    Pain Location Coccyx   Pain Orientation Left;Right   Pain Descriptors / Indicators Sharp   Pain Type Chronic pain   Pain Onset More than a month ago   Pain Frequency Intermittent   Aggravating Factors  sitting   Pain Relieving Factors shift position   Multiple Pain Sites No            [facility] PT Assessment - 01/14/16 0856      Assessment   Medical Diagnosis pelvic floor dysfunction   Referring Provider Dr. Jilda Panda   Onset Date/Surgical Date 05/08/15   Prior Therapy yes     Precautions   Precautions None     Restrictions   Weight Bearing Restrictions No     Balance Screen   Has the patient fallen in the past 6 months No   Has the patient  had a decrease in activity level because of a fear of falling?  No   Is the patient reluctant to leave their home because of a fear of falling?  No     Home Ecologist residence     Prior Function   Level of Independence Independent   Vocation Full time employment   Vocation Requirements sitting; standing desk   Leisure exercise at gym 3-5 times per week, elliptical , weight training     Cognition   Overall Cognitive Status Within Functional Limits for tasks assessed     Observation/Other Assessments   Focus on Therapeutic Outcomes (FOTO)  42% limitation     Posture/Postural Control   Posture/Postural Control No significant limitations     AROM   Overall AROM Comments lumbar ROM is full     Palpation   Spinal mobility L3-L5 rotated right    SI assessment  left ilium is rotated posteriorly; sacrum is rotated left                  Pelvic Floor Special Questions - 01/14/16 0001    Is this Painful No   Urinary Leakage No  [facility] Adult PT Treatment/Exercise - 01/14/16 0856      Manual Therapy   Manual Therapy Joint mobilization;Soft tissue mobilization   Manual therapy comments soft tissue work to right bulbocavernosus with right hip rotation with adduction in supine   Joint Mobilization P-A rotational mobilizaton grade 3; shearing of righ tSI joint; sacral mobilization for rotational movement   Soft tissue mobilization trigger point massage to left coccygeus and levator ani in quadruped with forward/backward movement then diagonals; trigger point massage to righ ttransverse perineum                PT Education - 01/14/16 0923    Education provided Yes   Education Details trunk rotation   Person(s) Educated Patient   Methods Explanation;Demonstration;Verbal cues;Handout   Comprehension Returned demonstration;Verbalized understanding          PT Short Term Goals - 01/14/16 0924      PT SHORT TERM GOAL #1    Title independent with initial HEP   Time 4   Period Weeks   Status Achieved     PT SHORT TERM GOAL #2   Title urinary leakage with laughing decreased >/= 25%   Time 4   Period Weeks   Status Achieved           PT Long Term Goals - 01/14/16 9509      PT LONG TERM GOAL #1   Title independent with HEP   Time 8   Period Weeks   Status On-going     PT LONG TERM GOAL #2   Title urinary leakage with laughing decreased >/= 80%   Time 8   Period Weeks   Status Achieved     PT LONG TERM GOAL #3   Title abdominal strength >/= 4/5 so she is able to do daily tasks with greater ease   Time 8   Period Weeks   Status On-going  abdominal strength is 3/5     PT LONG TERM GOAL #4   Title pain in right buttocks decreased >/= 75%   Time 8   Period Weeks   Status On-going  decreased by 25% and hurts with sitting               Plan - 01/14/16 0925    Clinical Impression Statement Patient has had no urinary leakage since last visit.  Left ilium was posteriorly rotated and sacrum was rotated left.  L3-L5 rotated right.  Patient reports pain level 3/10 in coccy area that radiates to the hip with sitting. Tenderness located in bil. coccygeus. After therapy patient had no pain and pelvis in correct alignment.  Patient will benefit from physical therapy to reduce pain and improve pelvic floor muscle strength.    Rehab Potential Excellent   Clinical Impairments Affecting Rehab Potential None   PT Frequency 1x / week   PT Duration 6 weeks   PT Treatment/Interventions Biofeedback;Electrical Stimulation;Cryotherapy;Ultrasound;Moist Heat;Therapeutic activities;Therapeutic exercise;Neuromuscular re-education;Patient/family education;Manual techniques   PT Next Visit Plan work on coccygeus and lumbar mobility    PT Home Exercise Plan progress as needed   Consulted and Agree with Plan of Care Patient      Patient will benefit from skilled therapeutic intervention in order to improve the  following deficits and impairments:  Decreased mobility, Decreased endurance, Increased fascial restricitons, Increased muscle spasms  Visit Diagnosis: Muscle weakness (generalized) - Plan: PT plan of care cert/re-cert  Unspecified lack of coordination - Plan: PT plan of care cert/re-cert  Other muscle  spasm - Plan: PT plan of care cert/re-cert     Problem List Patient Active Problem List   Diagnosis Date Noted  . History of abnormal Pap smear 02/17/2012  . History of IBS 02/17/2012  . Normal pregnancy 09/13/2010    Earlie Counts, PT 01/14/16 9:30 AM   [facility] [address], [place], [place], [address] Phone: [phone]   Fax:  [phone]  Name: Alyssa Vazquez MRN: [id number] Date of Birth: 1980/10/19  PHYSICAL THERAPY DISCHARGE SUMMARY  Visits from Start of Care: 5 Current functional level related to goals / functional outcomes: See above.  Patient did not return since last visit on 01/14/2016. Therapist called and left a message but no phone call.    Remaining deficits: See above.    Education / Equipment: HEP Plan:                                                    Patient goals were not met. Patient is being discharged due to not returning since the last visit.  Thank you for the referral. Earlie Counts, PT 02/06/16 8:21 AM  ?????

## 2016-01-24 ENCOUNTER — Telehealth (HOSPITAL_COMMUNITY): Payer: Self-pay

## 2016-01-24 NOTE — Telephone Encounter (Signed)
PT l/m to call about appt. /C/B at 317pm 11/17 L/M for pt to call back [phone]

## 2016-10-06 ENCOUNTER — Encounter (HOSPITAL_COMMUNITY): Payer: Self-pay | Admitting: Emergency Medicine

## 2016-10-06 ENCOUNTER — Emergency Department (HOSPITAL_COMMUNITY): Payer: PRIVATE HEALTH INSURANCE

## 2016-10-06 ENCOUNTER — Ambulatory Visit (HOSPITAL_COMMUNITY)
Admission: EM | Admit: 2016-10-06 | Discharge: 2016-10-06 | Disposition: A | Discharge status: Home / Self Care | Payer: PRIVATE HEALTH INSURANCE | Attending: Emergency Medicine | Admitting: Emergency Medicine

## 2016-10-06 DIAGNOSIS — I493 Ventricular premature depolarization: Secondary | ICD-10-CM

## 2016-10-06 DIAGNOSIS — R002 Palpitations: Secondary | ICD-10-CM | POA: Diagnosis present

## 2016-10-06 DIAGNOSIS — R0789 Other chest pain: Secondary | ICD-10-CM

## 2016-10-06 DIAGNOSIS — Z79899 Other long term (current) drug therapy: Secondary | ICD-10-CM | POA: Diagnosis not present

## 2016-10-06 DIAGNOSIS — R0602 Shortness of breath: Secondary | ICD-10-CM | POA: Insufficient documentation

## 2016-10-06 LAB — BASIC METABOLIC PANEL
Anion gap: 7 (ref 5–15)
BUN: 13 mg/dL (ref 6–20)
CO2: 27 mmol/L (ref 22–32)
Calcium: 9.3 mg/dL (ref 8.9–10.3)
Chloride: 104 mmol/L (ref 101–111)
Creatinine, Ser: 0.84 mg/dL (ref 0.44–1.00)
GFR calc Af Amer: >60 mL/min (ref >60)
GFR calc non Af Amer: >60 mL/min (ref >60)
Glucose, Bld: 95 mg/dL (ref 65–99)
Potassium: 4 mmol/L (ref 3.5–5.1)
Sodium: 138 mmol/L (ref 135–145)

## 2016-10-06 LAB — CBC
HCT: 40.9 % (ref 36.0–46.0)
Hemoglobin: 13.3 g/dL (ref 12.0–15.0)
MCH: 27.9 pg (ref 26.0–34.0)
MCHC: 32.5 g/dL (ref 30.0–36.0)
MCV: 85.9 fL (ref 78.0–100.0)
PLATELETS: 232 10*3/uL (ref 150–400)
RBC: 4.76 MIL/uL (ref 3.87–5.11)
RDW: 13 % (ref 11.5–15.5)
WBC: 7.2 10*3/uL (ref 4.0–10.5)

## 2016-10-06 LAB — I-STAT TROPONIN, ED: Troponin i, poc: 0 ng/mL (ref 0.00–0.08)

## 2016-10-06 NOTE — ED Triage Notes (Signed)
PT reports palpitations for the last few months. PT has worn a Holter with no success. PT reports today palpitation episodes have been more frequent and have had some associated SOB.

## 2016-10-06 NOTE — ED Triage Notes (Addendum)
[facility], shob and central chest heaviness with intermittent lightheadedness starting at 2:45pm. Full episode lasted approximately 3 hours. Pt sts chest heaviness has dissipated but fluttering in chest is present still. [facility] noted one run of vtach lasting 3 beats.

## 2016-10-06 NOTE — Discharge Instructions (Signed)
Please go to Emergency Room 

## 2016-10-06 NOTE — ED Notes (Signed)
Patient discharged by provider - see note. PA-C recommends that patient go to emergency department. Patient left ambulatory with steady gait, no signs of distress noted.

## 2016-10-07 ENCOUNTER — Emergency Department (HOSPITAL_COMMUNITY)
Admission: EM | Admit: 2016-10-07 | Discharge: 2016-10-07 | Disposition: A | Discharge status: Home / Self Care | Payer: PRIVATE HEALTH INSURANCE | Attending: Emergency Medicine | Admitting: Emergency Medicine

## 2016-10-07 DIAGNOSIS — I493 Ventricular premature depolarization: Secondary | ICD-10-CM

## 2016-10-07 DIAGNOSIS — R0789 Other chest pain: Secondary | ICD-10-CM

## 2016-10-07 DIAGNOSIS — R002 Palpitations: Secondary | ICD-10-CM

## 2016-10-07 LAB — I-STAT TROPONIN, ED: Troponin i, poc: 0 ng/mL (ref 0.00–0.08)

## 2016-10-07 LAB — D-DIMER, QUANTITATIVE: D-Dimer, Quant: 0.35 ug/mL-FEU (ref 0.00–0.50)

## 2016-10-07 NOTE — Discharge Instructions (Signed)
You were seen today for palpitations. You were noted to have PVCs at urgent care. These are usually benign or not harmful. However, given the persistence of her symptoms, you need follow-up with cardiology for further evaluation. Observe for any pattern of symptoms. Limit caffeine use. Avoid stimulant drugs.

## 2016-10-07 NOTE — ED Provider Notes (Signed)
[facility]-EMERGENCY DEPT Provider Note   CSN: [id number] Arrival date & time: 10/06/16  1819  By signing my name below, I, Alyssa Vazquez, attest that this documentation has been prepared under the direction and in the presence of Horton, Mayer Maskerourtney F, MD. Electronically Signed: Karren CobbleNy'Kea Vazquez, ED Scribe. 10/07/16. 1:37 AM.  History   Chief Complaint Chief Complaint  Patient presents with  . Chest Pain  . Palpitations   The history is provided by the patient. No language interpreter was used.    HPI HPI Comments: Alyssa DurhamJaimee T Vazquez is a 36 y.o. female with no pertinent history, who presents to the Emergency Department complaining of sudden onset, persistent palpitations that began around lunch time today. She notes associated shortness of breath and chest heaviness. Pt reports today while she was eating lunch she had a sudden onset of "fluttering" in her chest and she describes it felt like "there was a weight on her chest". Pt states the episode last for three hours. She notes a hx of occasional palpation ever few month, but nothing to this extent that last this long. She reports she has been seen by PCP and has yearly EKGs, due to her recurrent episodes of palpitations. She reports she has seen on a previous EKG that she had a prolonged QT. She denies recent medication changes, increased caffeine usage, illness, or sick contact. No h/o PE/DVT or recent long travel. No PFHx of cardiac events.  Denies chest pain, fever, diaphoresis, vomiting, nausea, or light-headedness.  Patient was seen in urgent care earlier this evening. No notes available at this time. She reports that she had "3 beats of V. tach." She has to EKGs available. She had one episode of 3 PVCs in a row. Otherwise normal EKG.   Past Medical History:  Diagnosis Date  . History of abnormal Pap smear 02/17/2012   Abnormal in 1997; repeats have been normal  . IBS (irritable bowel syndrome)    Patient Active Problem List   Diagnosis Date  Noted  . History of abnormal Pap smear 02/17/2012  . History of IBS 02/17/2012  . Normal pregnancy 09/13/2010   Past Surgical History:  Procedure Laterality Date  . WISDOM TOOTH EXTRACTION  2001   OB History    Gravida Para Term Preterm AB Living   3 0 0 0 1 2   SAB TAB Ectopic Multiple Live Births   1 0 0 0       Home Medications    Prior to Admission medications   Medication Sig Start Date End Date Taking? Authorizing Provider  Cholecalciferol (VITAMIN D) 2000 UNITS tablet Take 2,000 Units by mouth daily.     Yes [provider]  Cyanocobalamin (VITAMIN B-12 PO) Take 1 tablet by mouth daily.   Yes [provider]  docusate sodium (COLACE) 100 MG capsule Take 100 mg by mouth daily.   Yes [provider]  Flaxseed Oil OIL Take 1 tablet by mouth daily.    Yes [provider]  Multiple Vitamin (MULTIVITAMIN) tablet Take 1 tablet by mouth daily.     Yes [provider]  Multiple Vitamins-Minerals (ZINC PO) Take 1 tablet by mouth daily.   Yes [provider]  Probiotic Product (PROBIOTIC FORMULA) CAPS Take 1 capsule by mouth daily.     Yes [provider]   Family History Family History  Problem Relation Age of Onset  . Hypertension Maternal Grandmother   . Cancer Maternal Grandfather  colon  . Hypertension Maternal Grandfather   . CVA Paternal Grandmother   . Cancer Paternal Grandmother        stomach and lung cancer  . Heart disease Paternal Grandmother    Social History Social History  Substance Use Topics  . Smoking status: Never Smoker  . Smokeless tobacco: Never Used  . Alcohol use 0.5 oz/week    1 Standard drinks or equivalent per week     Comment: 2-3 per month    Allergies   Codeine; Erythromycin; Penicillins; and Zithromax [azithromycin]  Review of Systems Review of Systems  Constitutional: Negative for diaphoresis and fever.  Respiratory: Positive for shortness of breath.        Chest  heaviness.  Cardiovascular: Positive for chest pain and palpitations.  Gastrointestinal: Negative for nausea and vomiting.  Neurological: Negative for light-headedness.  All other systems reviewed and are negative.   Physical Exam Updated Vital Signs BP 101/78   Pulse (!) 51   Temp 98 F (36.7 C) (Oral)   Resp 15   Ht 5\' 3"  (1.6 m)   Wt 72.6 kg (160 lb)   LMP 09/22/2016   SpO2 99%   BMI 28.34 kg/m   Physical Exam  Constitutional: She is oriented to person, place, and time. She appears well-developed and well-nourished.  HENT:  Head: Normocephalic and atraumatic.  Cardiovascular: Normal rate, regular rhythm and normal heart sounds.   Pulmonary/Chest: Effort normal and breath sounds normal. No respiratory distress. She has no wheezes.  Abdominal: Soft. There is no tenderness.  Musculoskeletal: She exhibits no edema.  Neurological: She is alert and oriented to person, place, and time.  Skin: Skin is warm and dry.  Psychiatric: She has a normal mood and affect.  Nursing note and vitals reviewed.    ED Treatments / Results  DIAGNOSTIC STUDIES: Oxygen Saturation is 100% on RA, normal by my interpretation.   COORDINATION OF CARE: 1:27 AM-Discussed next steps with pt. Pt verbalized understanding and is agreeable with the plan.   Labs (all labs ordered are listed, but only abnormal results are displayed) Labs Reviewed  BASIC METABOLIC PANEL  CBC  D-DIMER, QUANTITATIVE (NOT AT [facility])  I-STAT TROPONIN, ED  I-STAT TROPONIN, ED    EKG  EKG Interpretation  Date/Time:  Tuesday October 06 2016 18:29:11 EDT Ventricular Rate:  62 PR Interval:  104 QRS Duration: 82 QT Interval:  400 QTC Calculation: 406 R Axis:   78 Text Interpretation:  Sinus rhythm with short PR Nonspecific ST abnormality Abnormal ECG Similar to prior  Confirmed by Ross MarcusHorton, Courtney ([id number]) on 10/07/2016 1:08:38 AM       Radiology Dg Chest 2 View  Result Date: 10/06/2016 CLINICAL DATA:  Chest  heaviness today EXAM: CHEST  2 VIEW COMPARISON:  None. FINDINGS: Normal heart size. Lungs clear. No pneumothorax. No pleural effusion. IMPRESSION: No active cardiopulmonary disease. Electronically Signed   By: Jolaine ClickArthur  Hoss M.D.   On: 10/06/2016 19:51    Procedures Procedures (including critical care time)  Medications Ordered in ED Medications - No data to display   Initial Impression / Assessment and Plan / ED Course  I have reviewed the triage vital signs and the nursing notes.  Pertinent labs & imaging results that were available during my care of the patient were reviewed by me and considered in my medical decision making (see chart for details).    Patient presents with palpitations, chest heaviness, shortness of breath. She is nontoxic on exam. Vital signs reassuring. EKG  is sinus rhythm without evidence of arrhythmia. No PVCs noted on EKG or monitor. I have reviewed EKG from urgent care. She had 3 PVCs back to back. She reports that she was symptomatic at that time. Denies any alcohol or drug use. Has had similar symptoms in the past that have been self-limited. Troponin and metabolites reassuring. Repeat troponin and d-dimer are negative. I discussed with the patient that 9 times out of 10 PVCs are benign. Some people are more symptomatic than others.  Today her heart rate is in the 50s. She's not a candidate for beta blockade at this time. Recommend monitoring lifestyle changes. Reducing caffeine. Monitoring for patterns. Also recommend close cardiology follow-up for potential prolonged Holter monitoring. She reports one prior EKG with prolonged QT. QTC is normal today. No history of sudden cardiac death in the family. Doubt prolonged QT syndrome. Patient was reassured.  After history, exam, and medical workup I feel the patient has been appropriately medically screened and is safe for discharge home. Pertinent diagnoses were discussed with the patient. Patient was given return  precautions.   Final Clinical Impressions(s) / ED Diagnoses   Final diagnoses:  Atypical chest pain  Palpitations  PVC (premature ventricular contraction)    New Prescriptions Discharge Medication List as of 10/07/2016  2:56 AM     I personally performed the services described in this documentation, which was scribed in my presence. The recorded information has been reviewed and is accurate.     Shon Baton, MD 10/07/16 [phone]

## 2016-10-07 NOTE — ED Provider Notes (Addendum)
CSN: [id number]     Arrival date & time 10/06/16  1646 History   None    Chief Complaint  Patient presents with  . Palpitations   (Consider location/radiation/quality/duration/timing/severity/associated sxs/prior Treatment) Patient c/o heart palpitations and chest pressure today.  She states she has hx of heart palpitations and has had a holter monitor ordered by her PCP recently and was told it was normal according to patient.  She states today she developed moderate chest tightness and it is mild at present.   The history is provided by the patient.  Palpitations  Palpitations quality:  Irregular Onset quality:  Gradual Duration:  1 day Timing:  Intermittent Progression:  Worsening Chronicity:  Recurrent Relieved by:  None tried Worsened by:  Nothing Ineffective treatments:  None tried Associated symptoms: shortness of breath     Past Medical History:  Diagnosis Date  . History of abnormal Pap smear 02/17/2012   Abnormal in 1997; repeats have been normal  . IBS (irritable bowel syndrome)    Past Surgical History:  Procedure Laterality Date  . WISDOM TOOTH EXTRACTION  2001   Family History  Problem Relation Age of Onset  . Hypertension Maternal Grandmother   . Cancer Maternal Grandfather        colon  . Hypertension Maternal Grandfather   . CVA Paternal Grandmother   . Cancer Paternal Grandmother        stomach and lung cancer  . Heart disease Paternal Grandmother    Social History  Substance Use Topics  . Smoking status: Never Smoker  . Smokeless tobacco: Never Used  . Alcohol use 0.5 oz/week    1 Standard drinks or equivalent per week     Comment: 2-3 per month   OB History    Gravida Para Term Preterm AB Living   3 0 0 0 1 2   SAB TAB Ectopic Multiple Live Births   1 0 0 0       Review of Systems  Constitutional: Negative.   HENT: Negative.   Eyes: Negative.   Respiratory: Positive for shortness of breath.   Cardiovascular: Positive for  palpitations.  Gastrointestinal: Negative.   Endocrine: Negative.   Genitourinary: Negative.   Musculoskeletal: Negative.   Allergic/Immunologic: Negative.   Neurological: Negative.   Hematological: Negative.   Psychiatric/Behavioral: Negative.     Allergies  Codeine; Erythromycin; Penicillins; and Zithromax [azithromycin]  Home Medications   Prior to Admission medications   Medication Sig Start Date End Date Taking? Authorizing Provider  Cholecalciferol (VITAMIN D) 2000 UNITS tablet Take 2,000 Units by mouth daily.      [provider]  Cyanocobalamin (VITAMIN B-12 PO) Take 1 tablet by mouth daily.    [provider]  docusate sodium (COLACE) 100 MG capsule Take 100 mg by mouth daily.    [provider]  Flaxseed Oil OIL Take 1 tablet by mouth daily.     [provider]  Multiple Vitamin (MULTIVITAMIN) tablet Take 1 tablet by mouth daily.      [provider]  Multiple Vitamins-Minerals (ZINC PO) Take 1 tablet by mouth daily.    [provider]  Probiotic Product (PROBIOTIC FORMULA) CAPS Take 1 capsule by mouth daily.      [provider]   Meds Ordered and Administered this Visit  Medications - No data to display  BP 121/79   Pulse 73   Temp 98.9 F (37.2 C) (Oral)   Resp 16   Ht 5\' 3"  (1.6  m)   Wt 160 lb (72.6 kg)   LMP 09/22/2016   SpO2 100%   BMI 28.34 kg/m  No data found.   Physical Exam  Constitutional: She appears well-developed and well-nourished.  HENT:  Head: Normocephalic and atraumatic.  Eyes: Pupils are equal, round, and reactive to light. Conjunctivae and EOM are normal.  Neck: Normal range of motion. Neck supple.  Cardiovascular: Normal rate, regular rhythm and normal heart sounds.   Pulmonary/Chest: Effort normal and breath sounds normal.  Abdominal: Soft. Bowel sounds are normal.  Nursing note and vitals reviewed.   Urgent Care Course     Procedures (including critical care  time)  Labs Review Labs Reviewed - No data to display  Imaging Review Dg Chest 2 View  Result Date: 10/06/2016 CLINICAL DATA:  Chest heaviness today EXAM: CHEST  2 VIEW COMPARISON:  None. FINDINGS: Normal heart size. Lungs clear. No pneumothorax. No pleural effusion. IMPRESSION: No active cardiopulmonary disease. Electronically Signed   By: Jolaine ClickArthur  Hoss M.D.   On: 10/06/2016 19:51     Visual Acuity Review  Right Eye Distance:   Left Eye Distance:   Bilateral Distance:    Right Eye Near:   Left Eye Near:    Bilateral Near:         MDM   1. Other chest pain   2. PVC's (premature ventricular contractions)    EKG with NSR without acute ST-T changes  3 beat run of pvc's with rhythm strip Go to the ED for higher level of care    Deatra CanterOxford, Lamekia Nolden J, FNP 10/07/16 1035    Deatra Canterxford, Ammy Lienhard J, FNP 10/07/16 1036

## 2016-10-07 NOTE — ED Notes (Signed)
Pt stable, ambulatory, states understanding of discharge instructions 

## 2016-10-08 NOTE — Progress Notes (Signed)
Cardiology Office Note   Date:  10/09/2016   ID:  Alyssa DurhamJaimee T Shetley, DOB September 17, 1980, MRN [id number]  PCP:  Ralene OkMoreira, Roy, MD  Cardiologist:   Charlton HawsPeter Graison Leinberger, MD   No chief complaint on file.     History of Present Illness: Alyssa Vazquez is a 36 y.o. female who presents for consultation regarding palpitations and chest pain. Referred by Ross Marcusourtney [facility] [place]. Seen there 10/07/16. She notes associated shortness of breath and chest heaviness. Pt reports today while she was eating lunch she had a sudden onset of "fluttering" in her chest and she describes it felt like "there was a weight on her chest". Pt states the episode last for three hours. She notes a hx of occasional palpation ever few month, but nothing to this extent that last this long. She reports she has been seen by PCP and has yearly EKGs, due to her recurrent episodes of palpitations. She reports she has seen on a previous EKG that she had a prolonged QT. She denies recent medication changes, increased caffeine usage, illness, or sick contact. No h/o PE/DVT or recent long travel. No PFHx of cardiac events. At urgent care note ? 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 History of abnormal Pap smear 02/17/2012   Abnormal in 1997; repeats have been normal  . IBS (irritable bowel syndrome)     Past Surgical History:  Procedure Laterality Date  . WISDOM TOOTH EXTRACTION  2001     Current Outpatient Prescriptions  Medication Sig Dispense Refill  . Cholecalciferol (VITAMIN D) 2000 UNITS tablet Take 2,000 Units by mouth daily.      . Cyanocobalamin (VITAMIN B-12 PO) Take 1  tablet by mouth daily.    Marland Kitchen. docusate sodium (COLACE) 100 MG capsule Take 100 mg by mouth daily.    . Flaxseed Oil OIL Take 1 tablet by mouth daily.     . Multiple Vitamin (MULTIVITAMIN) tablet Take 1 tablet by mouth daily.      . Multiple Vitamins-Minerals (ZINC PO) Take 1 tablet by mouth daily.    . Probiotic Product (PROBIOTIC FORMULA) CAPS Take 1 capsule by mouth daily.       No current facility-administered medications for this visit.     Allergies:   Codeine; Erythromycin; Penicillins; and Zithromax [azithromycin]    Social History:  The patient  reports that she has never smoked. She has never used smokeless tobacco. She reports that she drinks about 0.5 oz of alcohol per week . She reports that she does not use drugs.   Family History:  The patient's family history includes CVA in her paternal grandmother; Cancer in her maternal grandfather and paternal grandmother; Heart disease in her paternal grandmother; Hypertension in her maternal grandfather and maternal grandmother.    ROS:  Please see the history of present illness.   Otherwise, review of systems are positive for none.   All other systems are reviewed and negative.    PHYSICAL EXAM: VS:  BP 114/68   Pulse 83   Ht 5\' 3"  (1.6 m)  Wt 165 lb (74.8 kg)   LMP 09/22/2016   SpO2 98%   BMI 29.23 kg/m  , BMI Body mass index is 29.23 kg/m. Affect appropriate Healthy:  appears stated age HEENT: normal Neck supple with no adenopathy JVP normal no bruits no thyromegaly Lungs clear with no wheezing and good diaphragmatic motion Heart:  S1/S2 no murmur, no rub, gallop or click PMI normal Abdomen: benighn, BS positve, no tenderness, no AAA no bruit.  No HSM or HJR Distal pulses intact with no bruits No edema Neuro non-focal Skin warm and dry No muscular weakness    EKG: 08/21/16 SR rate 57 normal  QT 413  SR rate 72 QT 370 10/06/16    Recent Labs: 10/06/2016: BUN 13; Creatinine, Ser 0.84; Hemoglobin 13.3; Platelets  232; Potassium 4.0; Sodium 138    Lipid Panel No results found for: CHOL, TRIG, HDL, CHOLHDL, VLDL, LDLCALC, LDLDIRECT    Wt Readings from Last 3 Encounters:  10/09/16 165 lb (74.8 kg)  10/06/16 160 lb (72.6 kg)  10/06/16 160 lb (72.6 kg)      Other studies Reviewed: Additional studies/ records that were reviewed today include: Notes Dr Ludwig ClarksMoreira labs ECG 48 hr holter [facility].    ASSESSMENT AND PLAN:  1.  Palpitations benign had monitor this month with [facility] benign  2. Chest Pain atypical cannot r/o panic attack f/u stress echo  3. Dyspnea f/u echo normal ECG and exam normal CXR and lung exam 4. PVC;s benign no runs/NSVT QT normal on last 2 ECG;s Echo to assess RV/LV function   Current medicines are reviewed at length with the patient today.  The patient does not have concerns regarding medicines.  The following changes have been made:  no change  Labs/ tests ordered today include: stress echo  Orders Placed This Encounter  Procedures  . ECHOCARDIOGRAM STRESS TEST     Disposition:   FU with cardiology PRN      Signed, Charlton HawsPeter Jansen Goodpasture, MD  10/09/2016 2:36 PM    [facility] [address], [place], [place]  [address] Phone: [phone]; Fax: [phone]

## 2016-10-09 ENCOUNTER — Encounter (INDEPENDENT_AMBULATORY_CARE_PROVIDER_SITE_OTHER): Payer: Self-pay

## 2016-10-09 ENCOUNTER — Encounter: Payer: Self-pay | Admitting: Cardiovascular Disease

## 2016-10-09 ENCOUNTER — Ambulatory Visit (INDEPENDENT_AMBULATORY_CARE_PROVIDER_SITE_OTHER): Payer: PRIVATE HEALTH INSURANCE | Admitting: Cardiovascular Disease

## 2016-10-09 VITALS — BP 114/68 | HR 83 | Ht 63.0 in | Wt 165.0 lb

## 2016-10-09 DIAGNOSIS — R002 Palpitations: Secondary | ICD-10-CM | POA: Diagnosis not present

## 2016-10-09 DIAGNOSIS — R079 Chest pain, unspecified: Secondary | ICD-10-CM | POA: Diagnosis not present

## 2016-10-09 NOTE — Patient Instructions (Addendum)
Medication Instructions:  Your physician recommends that you continue on your current medications as directed. Please refer to the Current Medication list given to you today.  Labwork: NONE  Testing/Procedures: Your physician has requested that you have a stress echocardiogram. For further information please visit www.[email or web address]. Please follow instruction sheet as given.  Follow-Up: Your physician wants you to follow-up as needed with Dr. Nishan.   If you need a refill on your cardiac medications before your next appointment, please call your pharmacy.    

## 2016-10-29 ENCOUNTER — Telehealth (HOSPITAL_COMMUNITY): Payer: Self-pay | Admitting: *Deleted

## 2016-10-29 NOTE — Telephone Encounter (Signed)
Left message on voicemail per DPR in reference to upcoming appointment scheduled on 11/02/16 at 7:30 with detailed instructions given per Stress Test Requisition Sheet for the test. LM to arrive 30 minutes early, and that it is imperative to arrive on time for appointment to keep from having the test rescheduled. If you need to cancel or reschedule your appointment, please call the office within 24 hours of your appointment. Failure to do so may result in a cancellation of your appointment, and a $50 no show fee. Phone number given for call back for any questions. Daneil Dolin

## 2016-11-02 ENCOUNTER — Ambulatory Visit (HOSPITAL_COMMUNITY): Payer: PRIVATE HEALTH INSURANCE | Attending: Cardiology

## 2016-11-02 ENCOUNTER — Ambulatory Visit (HOSPITAL_COMMUNITY): Payer: PRIVATE HEALTH INSURANCE

## 2016-11-02 DIAGNOSIS — R002 Palpitations: Secondary | ICD-10-CM

## 2016-11-02 DIAGNOSIS — R079 Chest pain, unspecified: Secondary | ICD-10-CM | POA: Insufficient documentation

## 2017-01-18 IMAGING — US US ABDOMEN COMPLETE
1 series · 14 of 25 positions shown · non-contrast
Comparison: None in PACs

CLINICAL DATA: Elevated liver function studies, nausea and
vomiting, no abdominal pain.

EXAM:
ABDOMEN ULTRASOUND COMPLETE

[Series 1: us abdomen complete · 0.20mm/px · 14 of 77 slices shown]
[im 1/77]
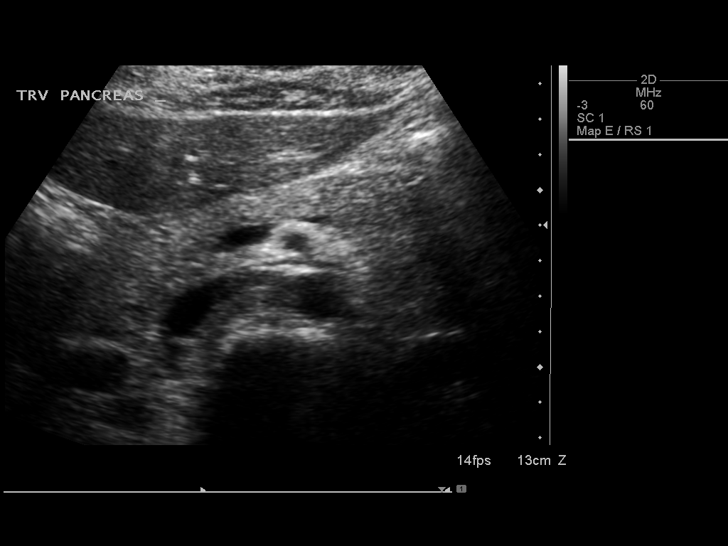
[im 7/77]
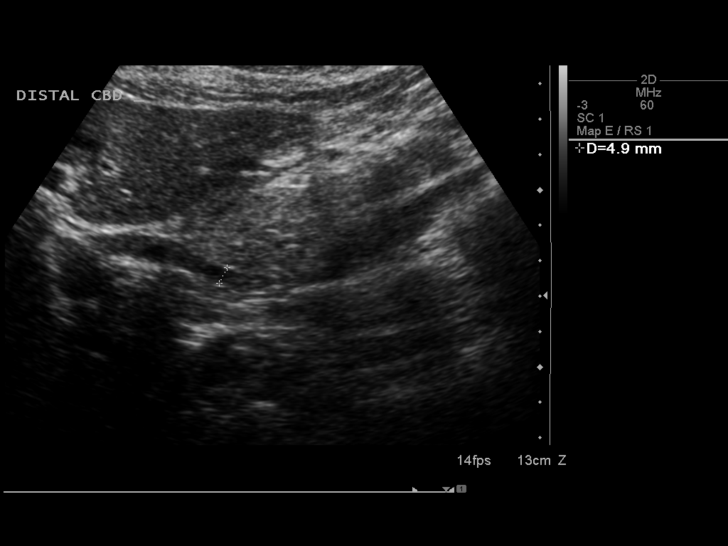
[im 13/77]
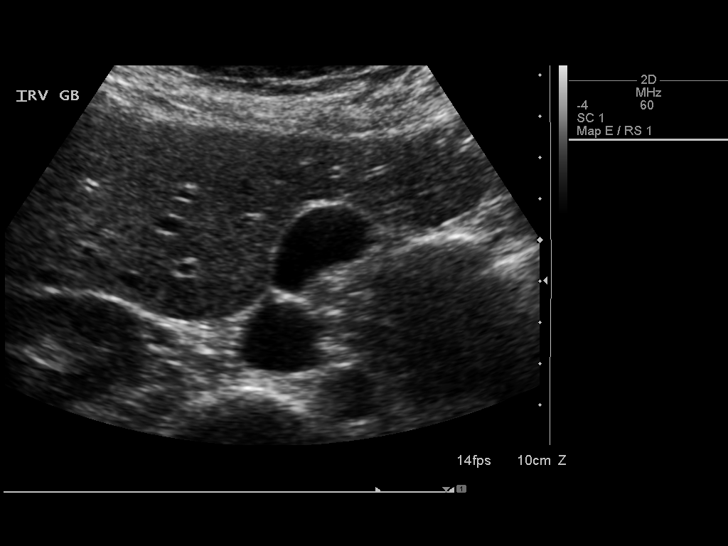
[im 20/77]
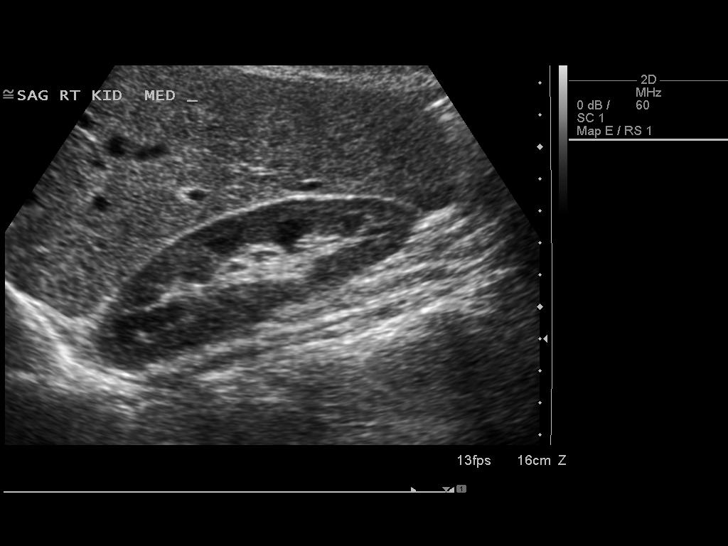
[im 26/77]
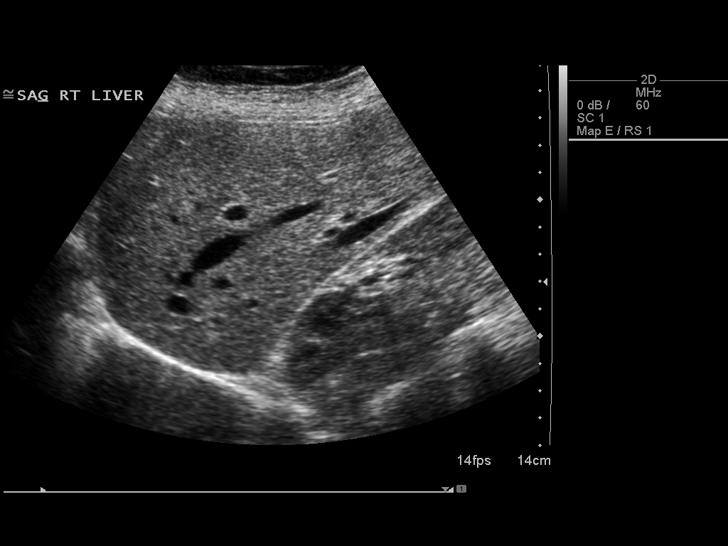
[im 29/77]
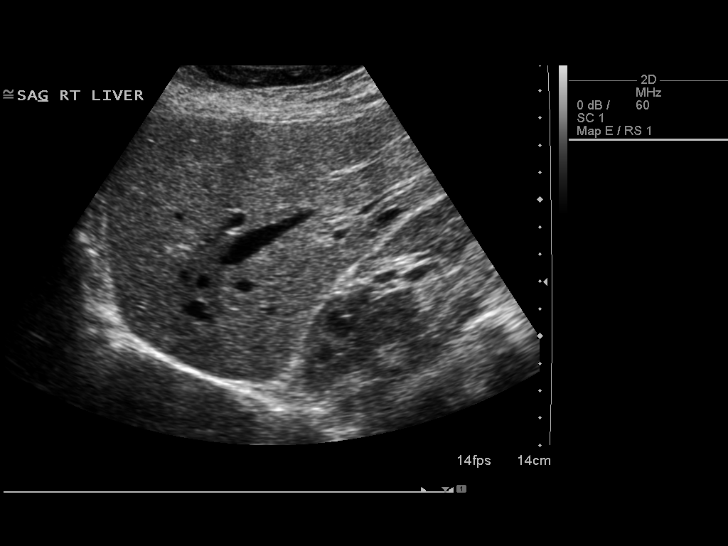
[im 35/77]
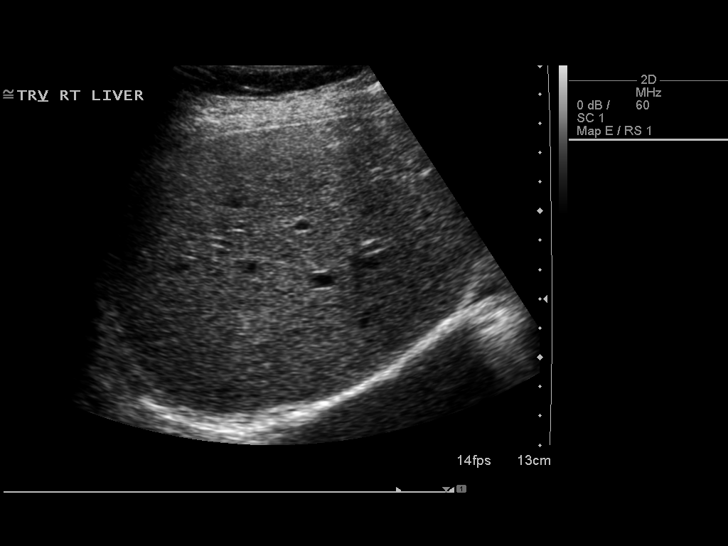
[im 42/77]
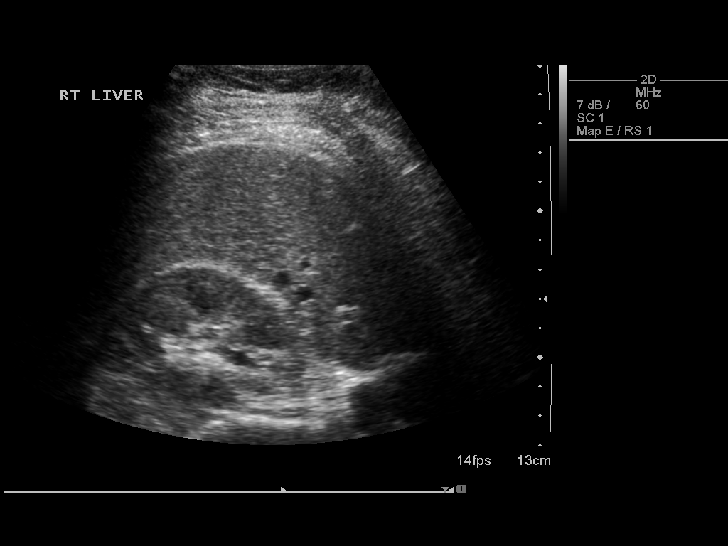
[im 48/77]
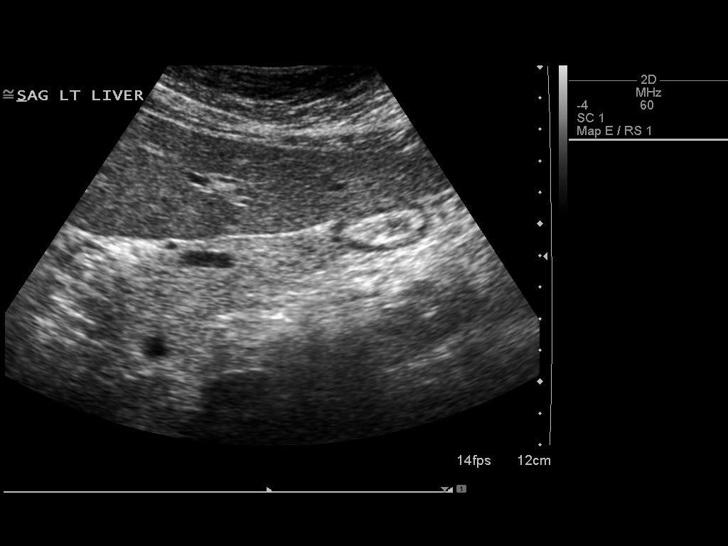
[im 51/77]
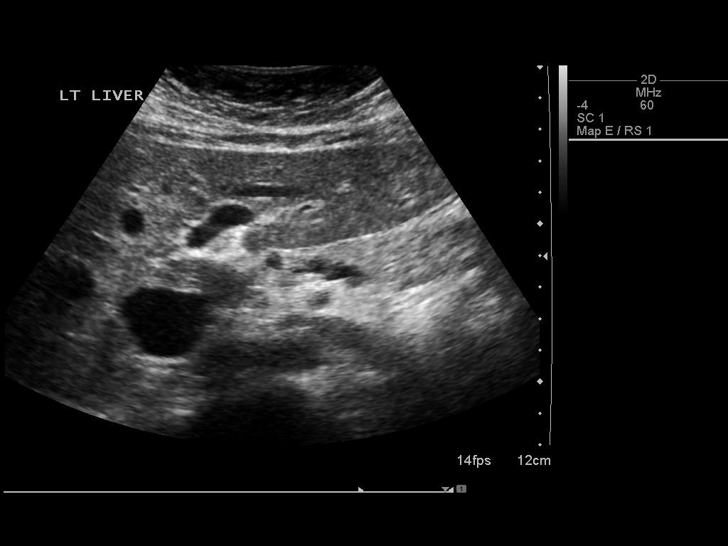
[im 58/77]
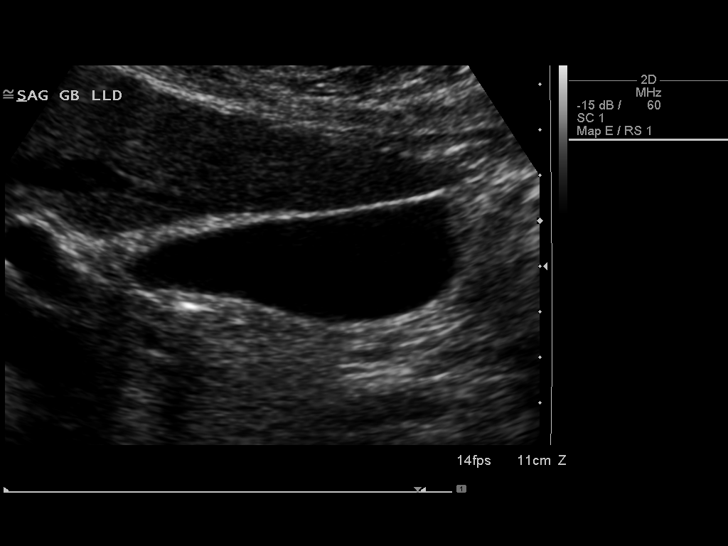
[im 64/77]
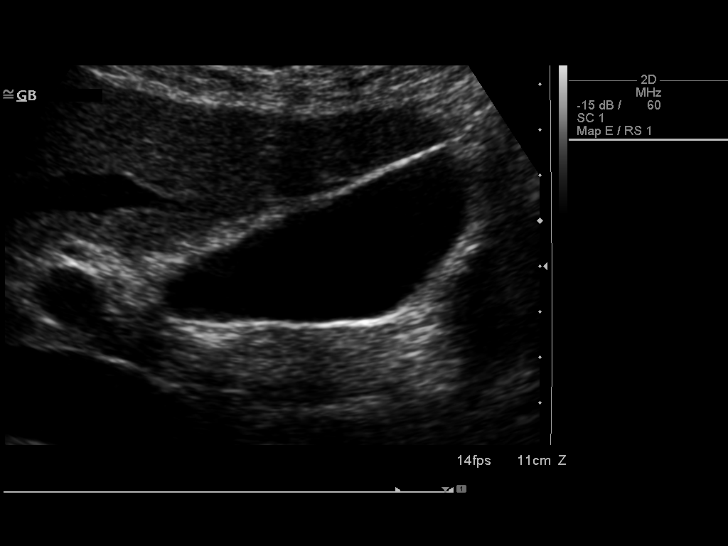
[im 70/77]
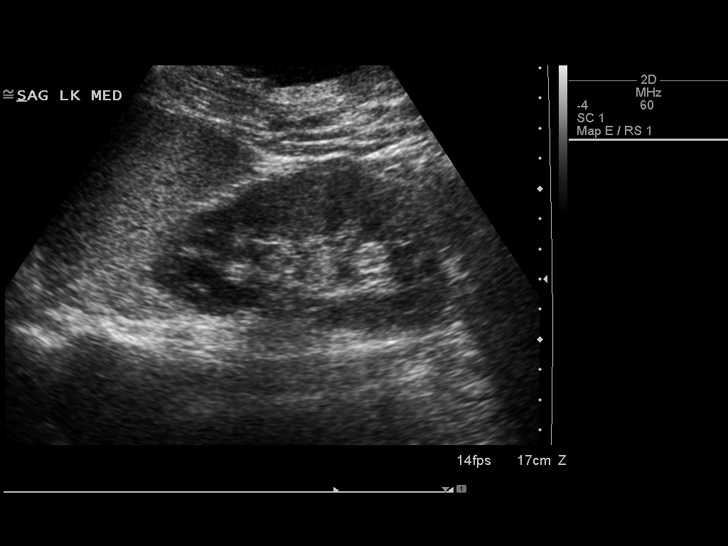
[im 77/77]
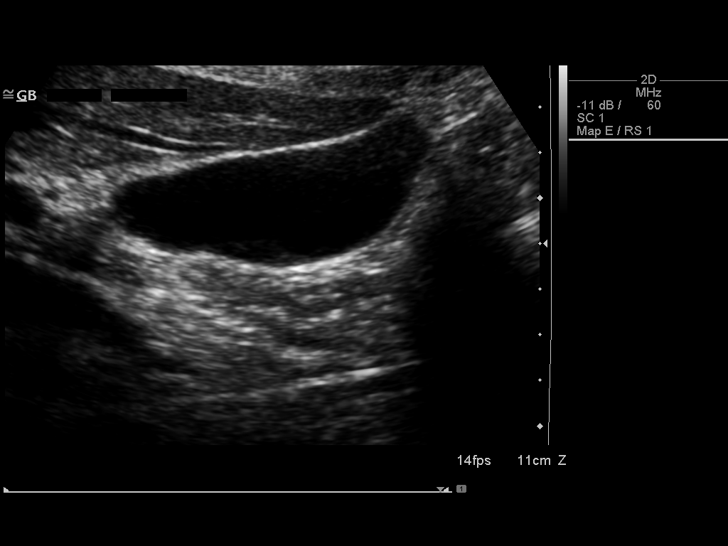

[14 of 25 positions shown; findings below may reference images not displayed]

FINDINGS: Gallbladder: The gallbladder is adequately distended with no
evidence of stones, wall thickening, or pericholecystic fluid. There
is no positive sonographic Murphy's sign.

Common bile duct: Diameter: 6.4 mm in maximal dimension. No
intraluminal stones are observed.

Liver: No focal lesion identified. Within normal limits in
parenchymal echogenicity.

IVC: No abnormality visualized.

Pancreas: Visualized portion unremarkable.

Spleen: Size and appearance within normal limits.

Right Kidney: Length: 11.2 cm. Echogenicity within normal limits. No
mass or hydronephrosis visualized.

Left Kidney: Length: 11.1 cm. Echogenicity within normal limits. No
mass or hydronephrosis visualized.

Abdominal aorta: No aneurysm visualized.

Other findings: None.
IMPRESSION: 1. No gallstones nor evidence of other hepatobiliary abnormality. If
there are clinical concerns of gallbladder dysfunction, a nuclear
medicine hepatobiliary scan may be useful.
2. No acute abnormality observed within the abdomen.

## 2017-05-19 ENCOUNTER — Ambulatory Visit: Payer: PRIVATE HEALTH INSURANCE | Attending: Obstetrics and Gynecology | Admitting: Physical Therapy

## 2017-05-19 ENCOUNTER — Encounter: Payer: Self-pay | Admitting: Physical Therapy

## 2017-05-19 DIAGNOSIS — R279 Unspecified lack of coordination: Secondary | ICD-10-CM

## 2017-05-19 DIAGNOSIS — M62838 Other muscle spasm: Secondary | ICD-10-CM | POA: Insufficient documentation

## 2017-05-19 DIAGNOSIS — M6281 Muscle weakness (generalized): Secondary | ICD-10-CM

## 2017-05-19 NOTE — Patient Instructions (Addendum)
   Lie flat (not on elbows as shown above).  Squeeze muscles around urethra like trying not to pee.  Hold working up to 5 seconds and repeat 10x Do 3x/day  Relaxation Exercises with the Urge to Void   When you experience an urge to void:  FIRST  Stop and stand very still    Sit down if you can    Don't move    You need to stay very still to maintain control  SECOND Squeeze your pelvic floor muscles 5 times, like a quick flick, to keep from leaking  THIRD Relax  Take a deep breath and then let it out  Try to make the urge go away by using relaxation and visualization techniques  FINALLY When you feel the urge go away somewhat, walk normally to the bathroom.   If the urge gets suddenly stronger on the way, you may stop again and relax to regain control.  Moisturizers . They are used in the vagina to hydrate the mucous membrane that make up the vaginal canal. . Designed to keep a more normal acid balance (ph) . Once placed in the vagina, it will last between two to three days.  . Use 2-3 times per week at bedtime and last longer than 60 min. . Ingredients to avoid is glycerin and fragrance, can increase chance of infection . Should not be used just before sex due to causing irritation . Most are gels administered either in a tampon-shaped applicator or as a vaginal suppository. They are non-hormonal.   Types of Moisturizers . Leatrice JewelsLuvena- drug store . Vitamin E vaginal suppositories- Whole foods, Amazon . Moist Again . Coconut oil- can break down condoms . Karlton LemonJulva- amazon . Yes moisturizer- amazon . NeuEve Silk , NeuEve Silver for menopausal or over 65 (if have severe vaginal atrophy or cancer treatments use NeuEve Silk for  1 month than move to Home DepoteuEve Silver)- Dana Corporationmazon, ShapeConsultant.com.cyNeuve.com . Olive and Bee intimate cream- www.oliveandbee.com.au  Creams to use externally on the Vulva area  Marathon OilDesert Harvest Releveum (good for for cancer patients that had radiation to the area)- Guamamazon or  Newell Rubbermaidwww.https://garcia-valdez.org/desertharvest.com  V-magic cream - amazon  Julva-amazon  Vital "V Wild Yam salve ( help moisturize and help with thinning vulvar area, does have Beeswax  The KrogerMoodMaid Botanical Pro-Meno Wild Yam Cream- Energy East Corporationmazon  Desert Harvest Gele   Things to avoid in the vaginal area . Do not use things to irritate the vulvar area . No lotions just specialized creams for the vulva area- Neogyn, V-magic, No soaps; can use Aveeno or Calendula cleanser if needed. Must be gentle . No deodorants . No douches . Good to sleep without underwear to let the vaginal area to air out . No scrubbing: spread the lips to let warm water rinse over labias and pat dry    [facility] [address], [address] [place], [place] [address] Phone # [phone] Fax [phone]

## 2017-05-20 ENCOUNTER — Other Ambulatory Visit: Payer: Self-pay

## 2017-05-20 NOTE — Therapy (Signed)
[facility] [address], [address] [place], [place], [address] Phone: [phone]   Fax:  [phone]  Physical Therapy Evaluation  Patient Details  Name: Alyssa DurhamJaimee T Beckum MRN: [id number] Date of Birth: 1980-04-19 Referring Provider: Henreitta LeberElmira Powell, PA   Encounter Date: 05/19/2017    Past Medical History:  Diagnosis Date  . History of abnormal Pap smear 02/17/2012   Abnormal in 1997; repeats have been normal  . IBS (irritable bowel syndrome)     Past Surgical History:  Procedure Laterality Date  . WISDOM TOOTH EXTRACTION  2001    There were no vitals filed for this visit.   Subjective Assessment - 05/19/17 0804    Subjective  Pt states she has had some incontinence, notices when raising her voice.  Pt states she has tenderness to low back and some discomfort         [facility] PT Assessment - 05/19/17 0001      Assessment   Medical Diagnosis  N81.89 other female genital prolapse    Referring Provider  Henreitta LeberElmira Powell, PA      Precautions   Precautions  None      Restrictions   Weight Bearing Restrictions  No      Balance Screen   Has the patient fallen in the past 6 months  No      Home Environment   Living Environment  Private residence    Living Arrangements  Spouse/significant other;Children      Prior Function   Level of Independence  Independent      Cognition   Overall Cognitive Status  Within Functional Limits for tasks assessed      Observation/Other Assessments   Focus on Therapeutic Outcomes (FOTO)   UDI-6 = 7/24      Posture/Postural Control   Posture/Postural Control  Postural limitations    Postural Limitations  Anterior pelvic tilt      Palpation   SI assessment   right pelvic obliquity - anterior rotation             Objective measurements completed on examination: See above findings.    Pelvic Floor Special Questions - 05/19/17 0001    Prior Pelvic/Prostate Exam  Yes    Are you Pregnant or attempting pregnancy?  No    Prior Pregnancies  Yes    Number of Pregnancies  2    Number of Vaginal Deliveries  2    Currently Sexually Active  Yes    Is this Painful  -- skin issues from     [facility]  pain prevents any attempts at intercourse    Urinary Leakage  Yes    How often  1x/week    Urinary urgency  Yes    Falling out feeling (prolapse)  -- unsure    Skin Integrity  Intact    Prolapse  Anterior Wall    Pelvic Floor Internal Exam  pt informed and consent given to perform internal pelvic floor assessment    Exam Type  Vaginal    Palpation  right coccygeus tight    Strength  good squeeze, good lift, able to hold agaisnt strong resistance    Strength # of seconds  4                              Patient will benefit from skilled therapeutic intervention in order to improve the following deficits and impairments:  Visit Diagnosis: Muscle weakness (generalized)  Unspecified lack of coordination  Other muscle spasm     Problem List Patient Active Problem List   Diagnosis Date Noted  . History of abnormal Pap smear 02/17/2012  . History of IBS 02/17/2012  . Normal pregnancy 09/13/2010    Vincente Poli 05/20/2017, 7:58 AM  [facility] [address], [address] [place], [place], [address] Phone: [phone]   Fax:  [phone]  Name: [name] MRN: [id number] Date of Birth: 03/20/1980

## 2017-05-20 NOTE — Therapy (Addendum)
[facility] [address] [place], [place], [address] Phone: [phone]   Fax:  [phone]  Physical Therapy Evaluation  Patient Details  Name: Alyssa Vazquez MRN: [id number] Date of Birth: 1981/02/27 Referring Provider: Henreitta LeberElmira Powell   Encounter Date: 05/19/2017  PT End of Session - 05/20/17 1746    Visit Number  1    Date for PT Re-Evaluation  07/14/17    PT Start Time  0800    PT Stop Time  0844    PT Time Calculation (min)  44 min    Activity Tolerance  Patient tolerated treatment well    Behavior During Therapy  [facility] for tasks assessed/performed       Past Medical History:  Diagnosis Date  . History of abnormal Pap smear 02/17/2012   Abnormal in 1997; repeats have been normal  . IBS (irritable bowel syndrome)     Past Surgical History:  Procedure Laterality Date  . WISDOM TOOTH EXTRACTION  2001    There were no vitals filed for this visit.   Subjective: Pt states she has had some incontinence, notices when raising her voice. Pt states she has tenderness to low back and some discomfort during sex   History: 2 vaginal deliveries Limitations: self care/urinary incontinence Patient goals: stop having leakage No/Denies pain currently   [facility] Assessment - 05/21/17 0001      Assessment   Medical Diagnosis  N81.89 other female genital prolapse    Referring Provider  Elmira Powell    Onset Date/Surgical Date  -- chronic, new onset of leakage    Prior Therapy  yes      Precautions   Precautions  None      Restrictions   Weight Bearing Restrictions  No      Balance Screen   Has the patient fallen in the past 6 months  No      Home Environment   Living Environment  Private residence    Living Arrangements  Spouse/significant other;Children      Prior Function   Level of Independence  Independent      Cognition   Overall Cognitive Status  Within Functional Limits for tasks assessed       Observation/Other Assessments   Focus on Therapeutic Outcomes (FOTO)   UDI-6 = 7/24      Posture/Postural Control   Posture/Postural Control  Postural limitations    Postural Limitations  Anterior pelvic tilt      PROM   Overall PROM Comments  WFL      Strength   Overall Strength Comments  bilateral LE 5/5 exept left hip extension 4/5      Palpation   SI assessment   right pelvic obliquity - anterior rotation      Special Tests   Other special tests  prone knee flexion test - positive Rt side      Ambulation/Gait   Gait Pattern  Within Functional Limits             Objective measurements completed on examination: See above findings.    Pelvic Floor Special Questions - 05/21/17 0001    Prior Pelvic/Prostate Exam  Yes    Are you Pregnant or attempting pregnancy?  No    Prior Pregnancies  Yes    Number of Pregnancies  2    Number of Vaginal Deliveries  2    Any difficulty with labor and deliveries  No    Currently Sexually Active  Yes    Is this Painful  -- skin issues from     [facility] Scale  pain prevents any attempts at intercourse    Urinary Leakage  Yes    How often  1x/week    Pad use  no    Activities that cause leaking  Coughing;Sneezing raising voice    Urinary urgency  Yes    Fecal incontinence  No    Falling out feeling (prolapse)  -- unsure    Skin Integrity  Intact    Prolapse  Anterior Wall    Pelvic Floor Internal Exam  pt informed and consent given to perform internal pelvic floor assessment    Exam Type  Vaginal    Palpation  right coccygeus tight    Strength  good squeeze, good lift, able to hold agaisnt strong resistance    Strength # of seconds  4    Tone  posterior muscle spasm, anterior weakness               PT Education - 05/20/17 1744    Education provided  Yes    Education Details  prone kegels, moisturizers, urge to void techniques    Person(s) Educated  Patient    Methods  Explanation;Demonstration;Verbal  cues;Handout    Comprehension  Verbalized understanding;Returned demonstration       PT Short Term Goals - 05/21/17 0837      PT SHORT TERM GOAL #1   Title  independent with initial HEP    Time  4    Period  Weeks    Status  New    Target Date  06/17/17      PT SHORT TERM GOAL #2   Title  urinary leakage with laughing/coughing decreased >/= 25%    Time  4    Period  Weeks    Status  New    Target Date  06/17/17        PT Long Term Goals - 05/21/17 0839      PT LONG TERM GOAL #1   Title  independent with HEP    Time  8    Period  Weeks    Status  New    Target Date  07/14/17      PT LONG TERM GOAL #2   Title  urinary leakage with laughing decreased >/= 80%    Time  8    Period  Weeks    Status  New    Target Date  07/14/17      PT LONG TERM GOAL #3   Title  able to demonstrate >/= to 3/5 pelvic floor contraction with 10 second hold x 5 reps in order to demonstrate endurance needed to run/jump with her children and not experience leakage.    Time  8    Period  Weeks    Status  New    Target Date  07/14/17      PT LONG TERM GOAL #4   Title  pain and tenderness in low back decreased >/= 75%    Time  8    Period  Weeks    Status  New    Target Date  07/14/17             Plan - 05/20/17 1747    Clinical Impression Statement  Patient is a 37 year old female with diagnosis of pelvic floor dysfunction.  Patient had physical therapy in the past but some symtoms are reocurring.  Patient reports urinary  incontinence with laughing and projecting her voice.  She has skin irritation of perineum and vaginal canal that cause pain with intercourse 3/3 on Marinoff scale.  Patient can feel more weakness recently and having a little of low back pain and tenderness. Right ilium is rotated anteriorly.  Sacrum is rotated right. Decreased mobility of L1-L5. Left hip extensor is weak. Patient is low complexity due to no comorbities to affect treatment. Patient will benefit form  skilled therapy to improve strength and reduce leakage    History and Personal Factors relevant to plan of care:  2 vaginal deliveries    Clinical Presentation  Stable    Clinical Presentation due to:  pt is stable    Clinical Decision Making  Low    Rehab Potential  Excellent    PT Frequency  2x / week reduced to 1x after 2-3 wks    PT Duration  8 weeks    PT Treatment/Interventions  ADLs/Self Care Home Management;Biofeedback;Therapeutic activities;Therapeutic exercise;Neuromuscular re-education;Patient/family education;Manual techniques;Passive range of motion;Dry needling;Taping    PT Next Visit Plan  dry needle to lumbar paraspinals, pelvic alignment, prone knee flexion re-assess and correct, internal STM to levator ani, coccygeus, ischiocavernoiss, biofeedback    Consulted and Agree with Plan of Care  Patient       Patient will benefit from skilled therapeutic intervention in order to improve the following deficits and impairments:  Pain, Increased muscle spasms, Decreased strength, Decreased coordination  Visit Diagnosis: Muscle weakness (generalized)  Unspecified lack of coordination  Other muscle spasm     Problem List Patient Active Problem List   Diagnosis Date Noted  . History of abnormal Pap smear 02/17/2012  . History of IBS 02/17/2012  . Normal pregnancy 09/13/2010    Vincente Poli, PT 05/21/2017, 8:59 AM  [facility] [address], [address] [place], [place], [address] Phone: [phone]   Fax:  [phone]  Name: Alyssa Vazquez MRN: [id number] Date of Birth: 14-Jun-1980

## 2017-05-21 NOTE — Addendum Note (Signed)
Addended by: Dorie RankROSSER, JACQUELINE D on: 05/21/2017 09:01 AM   Modules accepted: Orders

## 2017-05-25 ENCOUNTER — Ambulatory Visit: Payer: PRIVATE HEALTH INSURANCE | Admitting: Physical Therapy

## 2017-05-25 ENCOUNTER — Encounter: Payer: Self-pay | Admitting: Physical Therapy

## 2017-05-25 DIAGNOSIS — M6281 Muscle weakness (generalized): Secondary | ICD-10-CM

## 2017-05-25 DIAGNOSIS — M62838 Other muscle spasm: Secondary | ICD-10-CM

## 2017-05-25 DIAGNOSIS — R279 Unspecified lack of coordination: Secondary | ICD-10-CM

## 2017-05-25 NOTE — Therapy (Signed)
[facility] [address] [place], [place], [address] Phone: [phone]   Fax:  [phone]  Physical Therapy Treatment  Patient Details  Name: VERLON CARCIONE MRN: [id number] Date of Birth: December 19, 1980 Referring Provider: Henreitta Leber   Encounter Date: 05/25/2017  PT End of Session - 05/25/17 1238    Visit Number  2    Date for PT Re-Evaluation  07/14/17    PT Start Time  1234    PT Stop Time  1314    PT Time Calculation (min)  40 min    Activity Tolerance  Patient tolerated treatment well    Behavior During Therapy  [facility] for tasks assessed/performed       Past Medical History:  Diagnosis Date  . History of abnormal Pap smear 02/17/2012   Abnormal in 1997; repeats have been normal  . IBS (irritable bowel syndrome)     Past Surgical History:  Procedure Laterality Date  . WISDOM TOOTH EXTRACTION  2001    There were no vitals filed for this visit.  Subjective Assessment - 05/25/17 1632    Subjective  I was laughing really hard yesterday and thought I was going to pee myself.      Pertinent History  2 vaginal deliveries    Patient Stated Goals  stop having leakage    Currently in Pain?  No/denies                      [facility] Adult PT Treatment/Exercise - 05/25/17 0001      Neuro Re-ed    Neuro Re-ed Details   tapping for circular contraction, biofeedback with contract and relax with and without ball squeeze, bulging with inhale      Lumbar Exercises: Supine   Other Supine Lumbar Exercises  ball squeeze with pelvic floor contraction      Lumbar Exercises: Prone   Other Prone Lumbar Exercises  pelvic contraction with pelvic tilt      Manual Therapy   Manual Therapy  Internal Pelvic Floor    Manual therapy comments  right coccygeus and levator ani    Internal Pelvic Floor  pt informed and consent given to perform internal soft tissue work               PT Short Term Goals -  05/21/17 0837      PT SHORT TERM GOAL #1   Title  independent with initial HEP    Time  4    Period  Weeks    Status  New    Target Date  06/17/17      PT SHORT TERM GOAL #2   Title  urinary leakage with laughing/coughing decreased >/= 25%    Time  4    Period  Weeks    Status  New    Target Date  06/17/17        PT Long Term Goals - 05/21/17 0839      PT LONG TERM GOAL #1   Title  independent with HEP    Time  8    Period  Weeks    Status  New    Target Date  07/14/17      PT LONG TERM GOAL #2   Title  urinary leakage with laughing decreased >/= 80%    Time  8    Period  Weeks    Status  New    Target Date  07/14/17  PT LONG TERM GOAL #3   Title  able to demonstrate >/= to 3/5 pelvic floor contraction with 10 second hold x 5 reps in order to demonstrate endurance needed to run/jump with her children and not experience leakage.    Time  8    Period  Weeks    Status  New    Target Date  07/14/17      PT LONG TERM GOAL #4   Title  pain and tenderness in low back decreased >/= 75%    Time  8    Period  Weeks    Status  New    Target Date  07/14/17            Plan - 05/25/17 1401    Clinical Impression Statement  Patient did well with contracting and lifting pelvic floor when using the ball between her knees.  She does more bearing down when trying to contract without the ball at this time.  Pt was able to perform diaphragmatic breathing correctly and got a good contraction of pelvic floor on the exhale with moderate amount of tactile cues.  Pt will benefit from skilled PT to improve muscle coordination and return to maximum functional activities.    PT Treatment/Interventions  ADLs/Self Care Home Management;Biofeedback;Therapeutic activities;Therapeutic exercise;Neuromuscular re-education;Patient/family education;Manual techniques;Passive range of motion;Dry needling;Taping    PT Next Visit Plan  biofeedback, dry needle to lumbar paraspinals, pelvic  alignment, prone knee flexion re-assess and correct    Consulted and Agree with Plan of Care  Patient       Patient will benefit from skilled therapeutic intervention in order to improve the following deficits and impairments:  Pain, Increased muscle spasms, Decreased strength, Decreased coordination  Visit Diagnosis: Muscle weakness (generalized)  Unspecified lack of coordination  Other muscle spasm     Problem List Patient Active Problem List   Diagnosis Date Noted  . History of abnormal Pap smear 02/17/2012  . History of IBS 02/17/2012  . Normal pregnancy 09/13/2010    Vincente PoliJakki Crosser, PT 05/25/2017, 4:59 PM  [facility] [address], [address] [place], [place], [address] Phone: [phone]   Fax:  [phone]  Name: Dale DurhamJaimee T Quizon MRN: [id number] Date of Birth: Mar 23, 1980

## 2017-05-27 ENCOUNTER — Encounter: Payer: Self-pay | Admitting: Physical Therapy

## 2017-05-27 ENCOUNTER — Ambulatory Visit: Payer: PRIVATE HEALTH INSURANCE | Admitting: Physical Therapy

## 2017-05-27 DIAGNOSIS — M6281 Muscle weakness (generalized): Secondary | ICD-10-CM

## 2017-05-27 DIAGNOSIS — M62838 Other muscle spasm: Secondary | ICD-10-CM

## 2017-05-27 DIAGNOSIS — R279 Unspecified lack of coordination: Secondary | ICD-10-CM

## 2017-05-27 NOTE — Therapy (Signed)
[facility] [address] [place], [place], [address] Phone: [phone]   Fax:  [phone]  Physical Therapy Treatment  Patient Details  Name: Alyssa Vazquez MRN: [id number] Date of Birth: 10/02/1980 Referring Provider: Henreitta Leber   Encounter Date: 05/27/2017  PT End of Session - 05/27/17 1236    Visit Number  3    Date for PT Re-Evaluation  07/14/17    PT Start Time  1233    PT Stop Time  1314    PT Time Calculation (min)  41 min    Activity Tolerance  Patient tolerated treatment well    Behavior During Therapy  [facility] for tasks assessed/performed       Past Medical History:  Diagnosis Date  . History of abnormal Pap smear 02/17/2012   Abnormal in 1997; repeats have been normal  . IBS (irritable bowel syndrome)     Past Surgical History:  Procedure Laterality Date  . WISDOM TOOTH EXTRACTION  2001    There were no vitals filed for this visit.  Subjective Assessment - 05/27/17 1235    Subjective  I was laughing and felt like I didn't have to think about engaging so that was good.  Yesterday didn't do the exercises due to family members in the hospital.    Pertinent History  2 vaginal deliveries    Patient Stated Goals  stop having leakage    Currently in Pain?  No/denies                      [facility] Adult PT Treatment/Exercise - 05/27/17 0001      Neuro Re-ed    Neuro Re-ed Details   stroking for circular contraction, breathing with exercises      Lumbar Exercises: Supine   Other Supine Lumbar Exercises  lying on foam roller knee drop out, breathing UE flexion      Lumbar Exercises: Quadruped   [place]  10 reps    Single Arm Raise  10 reps with TrA and pelvic contraction      Manual Therapy   Manual therapy comments  pt informed and consent given to perform internal soft tissue work    Internal Pelvic Floor  myofascial mob to urethra and bladder             PT  Education - 05/27/17 1348    Education provided  Yes    Education Details  cat cow and quadruped reaching    Person(s) Educated  Patient    Methods  Explanation;Demonstration;Handout;Verbal cues;Tactile cues    Comprehension  Verbalized understanding;Returned demonstration       PT Short Term Goals - 05/27/17 1301      PT SHORT TERM GOAL #1   Title  independent with initial HEP    Time  4    Period  Weeks    Status  Achieved      PT SHORT TERM GOAL #2   Title  urinary leakage with laughing/coughing decreased >/= 25%    Baseline  able to laugh without leakage yesterday    Time  4    Period  Weeks    Status  On-going        PT Long Term Goals - 05/21/17 6962      PT LONG TERM GOAL #1   Title  independent with HEP    Time  8    Period  Weeks    Status  New  Target Date  07/14/17      PT LONG TERM GOAL #2   Title  urinary leakage with laughing decreased >/= 80%    Time  8    Period  Weeks    Status  New    Target Date  07/14/17      PT LONG TERM GOAL #3   Title  able to demonstrate >/= to 3/5 pelvic floor contraction with 10 second hold x 5 reps in order to demonstrate endurance needed to run/jump with her children and not experience leakage.    Time  8    Period  Weeks    Status  New    Target Date  07/14/17      PT LONG TERM GOAL #4   Title  pain and tenderness in low back decreased >/= 75%    Time  8    Period  Weeks    Status  New    Target Date  07/14/17            Plan - 05/27/17 1349    Clinical Impression Statement  Patient demonstrated improved circular contraction with tactile cues.  She was able to feel the muscles working more easily and able to progress HEP today.  Pt continues to need skilled PT for improved strength and muscle coordination in order to perform functinal activities without leakage.    Rehab Potential  Excellent    PT Treatment/Interventions  ADLs/Self Care Home Management;Biofeedback;Therapeutic activities;Therapeutic  exercise;Neuromuscular re-education;Patient/family education;Manual techniques;Passive range of motion;Dry needling;Taping    PT Next Visit Plan  progress core and pelvic floor strength, biofeedback with work on endurance    Recommended Other Services  orders signed    Consulted and Agree with Plan of Care  Patient       Patient will benefit from skilled therapeutic intervention in order to improve the following deficits and impairments:  Pain, Increased muscle spasms, Decreased strength, Decreased coordination  Visit Diagnosis: Muscle weakness (generalized)  Unspecified lack of coordination  Other muscle spasm     Problem List Patient Active Problem List   Diagnosis Date Noted  . History of abnormal Pap smear 02/17/2012  . History of IBS 02/17/2012  . Normal pregnancy 09/13/2010    Vincente PoliJakki Crosser, PT 05/27/2017, 1:55 PM  [facility] [address], [address] [place], [place], [address] Phone: [phone]   Fax:  [phone]  Name: Alyssa Vazquez MRN: [id number] Date of Birth: 04/30/1980

## 2017-05-27 NOTE — Patient Instructions (Signed)
   QUADRUPED ALTERNATE ARM   While in a crawling position, slowly raise up an arm out in front of you. Exhale and brace pelvic floor and lower abdomen as you lift your arm.  Hold 5 sec and repeat 10x     Cat Cow  Position yourself on your hands and knees with your hands placed under your shoulders and your knees directly under your hips. Slowly round your back up towards the ceiling and then arch your back down by pulling your abdomen towards the floor. Repeat 10x  [facility] [address], [address] [place], [place] [address] Phone # [phone] Fax [phone]

## 2017-05-31 ENCOUNTER — Ambulatory Visit: Payer: PRIVATE HEALTH INSURANCE | Admitting: Physical Therapy

## 2017-05-31 DIAGNOSIS — M62838 Other muscle spasm: Secondary | ICD-10-CM

## 2017-05-31 DIAGNOSIS — M6281 Muscle weakness (generalized): Secondary | ICD-10-CM

## 2017-05-31 DIAGNOSIS — R279 Unspecified lack of coordination: Secondary | ICD-10-CM

## 2017-05-31 NOTE — Therapy (Signed)
[facility] [address] [place], [place], [address] Phone: [phone]   Fax:  [phone]  Physical Therapy Treatment  Patient Details  Name: Alyssa DurhamJaimee T Bowdoin MRN: [id number] Date of Birth: August 12, 1980 Referring Provider: Henreitta LeberElmira Powell   Encounter Date: 05/31/2017  PT End of Session - 05/31/17 0821    Visit Number  4    Date for PT Re-Evaluation  07/14/17    PT Start Time  0808    PT Stop Time  0846    PT Time Calculation (min)  38 min    Activity Tolerance  Patient tolerated treatment well    Behavior During Therapy  [facility] for tasks assessed/performed       Past Medical History:  Diagnosis Date  . History of abnormal Pap smear 02/17/2012   Abnormal in 1997; repeats have been normal  . IBS (irritable bowel syndrome)     Past Surgical History:  Procedure Laterality Date  . WISDOM TOOTH EXTRACTION  2001    There were no vitals filed for this visit.  Subjective Assessment - 05/31/17 0844    Subjective  I haven't had any leakage.  I have been doing a little every day of the exercises    Pertinent History  2 vaginal deliveries    Patient Stated Goals  stop having leakage    Currently in Pain?  No/denies                No data recorded       [facility] Adult PT Treatment/Exercise - 05/31/17 0001      Neuro Re-ed    Neuro Re-ed Details   biofeedback: contract and hold supine 3sec, then hip abduction red band 3 sec holds, knacking, sitting on ball and marching with hold, breathing and bulging for relaxation      Lumbar Exercises: Seated   Sit to Stand  10 reps      sit to stand with biofeedback for engaged pelvic floor         PT Short Term Goals - 05/27/17 1301      PT SHORT TERM GOAL #1   Title  independent with initial HEP    Time  4    Period  Weeks    Status  Achieved      PT SHORT TERM GOAL #2   Title  urinary leakage with laughing/coughing decreased >/= 25%    Baseline   able to laugh without leakage yesterday    Time  4    Period  Weeks    Status  On-going        PT Long Term Goals - 05/21/17 0839      PT LONG TERM GOAL #1   Title  independent with HEP    Time  8    Period  Weeks    Status  New    Target Date  07/14/17      PT LONG TERM GOAL #2   Title  urinary leakage with laughing decreased >/= 80%    Time  8    Period  Weeks    Status  New    Target Date  07/14/17      PT LONG TERM GOAL #3   Title  able to demonstrate >/= to 3/5 pelvic floor contraction with 10 second hold x 5 reps in order to demonstrate endurance needed to run/jump with her children and not experience leakage.    Time  8  Period  Weeks    Status  New    Target Date  07/14/17      PT LONG TERM GOAL #4   Title  pain and tenderness in low back decreased >/= 75%    Time  8    Period  Weeks    Status  New    Target Date  07/14/17            Plan - 05/31/17 0911    Clinical Impression Statement  Patient did well with biofeedback today.  She was able to hold 8 sec contraction in sitting by the end of session and repeated 10x.  She continues to make progress with strengthening  She demonstrated contraction pelvic floor when laughing and coughing.  Pt will benefit from skilled PT to continue working on strength and endurance.    PT Treatment/Interventions  ADLs/Self Care Home Management;Biofeedback;Therapeutic activities;Therapeutic exercise;Neuromuscular re-education;Patient/family education;Manual techniques;Passive range of motion;Dry needling;Taping    PT Next Visit Plan  DN to lumbar multifidi, pelvic floor endurance and variety of positions, add standing    Consulted and Agree with Plan of Care  Patient       Patient will benefit from skilled therapeutic intervention in order to improve the following deficits and impairments:  Pain, Increased muscle spasms, Decreased strength, Decreased coordination  Visit Diagnosis: Muscle weakness  (generalized)  Unspecified lack of coordination  Other muscle spasm     Problem List Patient Active Problem List   Diagnosis Date Noted  . History of abnormal Pap smear 02/17/2012  . History of IBS 02/17/2012  . Normal pregnancy 09/13/2010    Vincente Poli, PT 05/31/2017, 9:25 AM  [facility] [address] [place], [place], [address] Phone: [phone]   Fax:  [phone]  Name: Alyssa Vazquez MRN: [id number] Date of Birth: 24-Jun-1980

## 2017-06-02 ENCOUNTER — Ambulatory Visit: Payer: PRIVATE HEALTH INSURANCE | Admitting: Physical Therapy

## 2017-06-02 ENCOUNTER — Encounter: Payer: Self-pay | Admitting: Physical Therapy

## 2017-06-02 DIAGNOSIS — M62838 Other muscle spasm: Secondary | ICD-10-CM

## 2017-06-02 DIAGNOSIS — R279 Unspecified lack of coordination: Secondary | ICD-10-CM

## 2017-06-02 DIAGNOSIS — M6281 Muscle weakness (generalized): Secondary | ICD-10-CM

## 2017-06-02 NOTE — Therapy (Signed)
[facility] [address], [address] [place], [place], [address] Phone: [phone]   Fax:  [phone]  Physical Therapy Treatment  Patient Details  Name: Alyssa DurhamJaimee T Renfroe MRN: [id number] Date of Birth: September 24, 1980 Referring Provider: Henreitta LeberElmira Powell   Encounter Date: 06/02/2017  PT End of Session - 06/02/17 0845    Visit Number  5    Date for PT Re-Evaluation  07/14/17    PT Start Time  0805    PT Stop Time  0844    PT Time Calculation (min)  39 min    Activity Tolerance  Patient tolerated treatment well    Behavior During Therapy  [facility] for [facility]/performed       Past Medical History:  Diagnosis Date  . History of abnormal Pap smear 02/17/2012   Abnormal in 1997; repeats have been normal  . IBS (irritable bowel syndrome)     Past Surgical History:  Procedure Laterality Date  . WISDOM TOOTH EXTRACTION  2001    There were no vitals filed for this visit.  Subjective Assessment - 06/02/17 1022    Subjective  I had a little leakage after holding it too long, but I think I waited too long.  I am not currently having back pain, but it's very sore to touch and feels more tight.    Pertinent History  2 vaginal deliveries    Patient Stated Goals  stop having leakage    Currently in Pain?  No/denies                No data recorded       [facility] Adult PT Treatment/Exercise - 06/02/17 0001      Lumbar Exercises: Stretches   Other Lumbar Stretch Exercise  supine with towel roll behind thoracic 8 pec stretch and thoracic extension - 5 x       Manual Therapy   Manual Therapy  Soft tissue mobilization;Myofascial release    Soft tissue mobilization  thoracic paraspinals T6-12, lumbar paraspinals    Myofascial Release  lumbar fascial release, pelvic diaphragm in supine with one hand on sacrum and one suprapubic, release in all 6 planes       Trigger Point Dry Needling - 06/02/17 1019    Consent Given?  Yes     Education Handout Provided  Yes    Muscles Treated Lower Body  -- L1-2 lumbar multifidi bilateral           PT Education - 06/02/17 0845    Education provided  Yes    Education Details  dry needle aftercare    Person(s) Educated  Patient    Methods  Explanation;Handout    Comprehension  Verbalized understanding       PT Short Term Goals - 05/27/17 1301      PT SHORT TERM GOAL #1   Title  independent with initial HEP    Time  4    Period  Weeks    Status  Achieved      PT SHORT TERM GOAL #2   Title  urinary leakage with laughing/coughing decreased >/= 25%    Baseline  able to laugh without leakage yesterday    Time  4    Period  Weeks    Status  On-going        PT Long Term Goals - 05/21/17 [id number]      PT LONG TERM GOAL #1   Title  independent with HEP    Time  8    Period  Weeks    Status  New    Target Date  07/14/17      PT LONG TERM GOAL #2   Title  urinary leakage with laughing decreased >/= 80%    Time  8    Period  Weeks    Status  New    Target Date  07/14/17      PT LONG TERM GOAL #3   Title  able to demonstrate >/= to 3/5 pelvic floor contraction with 10 second hold x 5 reps in order to demonstrate endurance needed to run/jump with her children and not experience leakage.    Time  8    Period  Weeks    Status  New    Target Date  07/14/17      PT LONG TERM GOAL #4   Title  pain and tenderness in low back decreased >/= 75%    Time  8    Period  Weeks    Status  New    Target Date  07/14/17            Plan - 06/02/17 0847    Clinical Impression Statement  Patient had good response from manual therapy with fascial release palpated.  She had twitch response and pain referral with needling to lumbar multifidi L1-2.  Pt demonstrates increased flexion and muscle tightness of thoracic paraspinals T8-10 and was educated in towel behind back for extension stretch.  Pt will benefit from skilled PT to continue to progress strength for greater  bladder control.    PT Treatment/Interventions  ADLs/Self Care Home Management;Biofeedback;Therapeutic activities;Therapeutic exercise;Neuromuscular re-education;Patient/family education;Manual techniques;Passive range of motion;Dry needling;Taping    PT Next Visit Plan  f/u on DN to lumbar multifidi, pelvic floor endurance and variety of positions, add standing    Consulted and Agree with Plan of Care  Patient       Patient will benefit from skilled therapeutic intervention in order to improve the following deficits and impairments:  Pain, Increased muscle spasms, Decreased strength, Decreased coordination  Visit Diagnosis: Muscle weakness (generalized)  Unspecified lack of coordination  Other muscle spasm     Problem List Patient Active Problem List   Diagnosis Date Noted  . History of abnormal Pap smear 02/17/2012  . History of IBS 02/17/2012  . Normal pregnancy 09/13/2010    Alyssa Vazquez, PT 06/02/2017, 10:23 AM  [facility] [address], [address] [place], [place], [address] Phone: [phone]   Fax:  [phone]  Name: Alyssa Vazquez MRN: [id number] Date of Birth: 1980-12-23

## 2017-06-02 NOTE — Patient Instructions (Signed)

## 2017-06-07 ENCOUNTER — Ambulatory Visit: Payer: PRIVATE HEALTH INSURANCE | Attending: Obstetrics and Gynecology | Admitting: Physical Therapy

## 2017-06-07 DIAGNOSIS — R279 Unspecified lack of coordination: Secondary | ICD-10-CM | POA: Insufficient documentation

## 2017-06-07 DIAGNOSIS — M62838 Other muscle spasm: Secondary | ICD-10-CM | POA: Diagnosis present

## 2017-06-07 DIAGNOSIS — M6281 Muscle weakness (generalized): Secondary | ICD-10-CM | POA: Insufficient documentation

## 2017-06-07 NOTE — Therapy (Signed)
[facility] [address], [address] [place], [place], [address] Phone: [phone]   Fax:  [phone]  Physical Therapy Treatment  Patient Details  Name: Alyssa Vazquez MRN: [id number] Date of Birth: July 07, 1980 Referring Provider: Henreitta Leber   Encounter Date: 06/07/2017  PT End of Session - 06/07/17 0843    Visit Number  6    Date for PT Re-Evaluation  07/14/17    PT Start Time  0805    PT Stop Time  0845    PT Time Calculation (min)  40 min    Activity Tolerance  Patient tolerated treatment well    Behavior During Therapy  [facility] for tasks assessed/performed       Past Medical History:  Diagnosis Date  . History of abnormal Pap smear 02/17/2012   Abnormal in 1997; repeats have been normal  . IBS (irritable bowel syndrome)     Past Surgical History:  Procedure Laterality Date  . WISDOM TOOTH EXTRACTION  2001    There were no vitals filed for this visit.  Subjective Assessment - 06/07/17 0811    Subjective  I had some soreness after the dry needling but then after that my back felt better.  Pt reports she     Pertinent History  2 vaginal deliveries    Limitations  -- coughing, sneezing    Patient Stated Goals  stop having leakage    Currently in Pain?  No/denies                No data recorded       [facility] Adult PT Treatment/Exercise - 06/07/17 0001      Neuro Re-ed    Neuro Re-ed Details   biofeedback to pelvic floor throughout all exercises inlcuding 5x10 sec hold with 5 sec rest      Lumbar Exercises: Aerobic   Elliptical  L2 incline 5 x 6 min      Lumbar Exercises: Seated   Hip Flexion on Ball  20 reps bracing pelvic floor    Other Seated Lumbar Exercises  kneeling with yellow ball lift - 20x; chalie's angels rotation in half kneel - 10x each way; pallof press red band in half kneel - 10x each way      Lumbar Exercises: Quadruped   Other Quadruped Lumbar Exercises  contract and hold               PT Short Term Goals - 06/07/17 0908      PT SHORT TERM GOAL #2   Title  urinary leakage with laughing/coughing decreased >/= 25%    Baseline  no leakage but still has a lot of urgency    Time  4    Period  Weeks    Status  Achieved        PT Long Term Goals - 06/07/17 0910      PT LONG TERM GOAL #1   Title  independent with HEP    Time  8    Period  Weeks    Status  On-going      PT LONG TERM GOAL #2   Title  urinary leakage with laughing decreased >/= 80%    Time  8    Period  Weeks    Status  On-going      PT LONG TERM GOAL #3   Title  able to demonstrate >/= to 3/5 pelvic floor contraction with 10 second hold x 5 reps in order to demonstrate  endurance needed to run/jump with her children and not experience leakage.    Baseline  able to maintain >16 mV 10 sec x 5 reps    Time  8    Period  Weeks    Status  On-going      PT LONG TERM GOAL #4   Title  pain and tenderness in low back decreased >/= 75%    Baseline  no tenderness today, feeling better    Time  8    Period  Weeks    Status  On-going            Plan - 06/07/17 0916    Clinical Impression Statement  Pt has achieved goal of less leakage.  She is making good progress towards all other long term goals as mentioned above.  Pt did well with exercises using biofeedback and is able to brace and engage pelvic floor for longer stretches of time.  She will continue to benefit from biofeedback in order to work on increased contraction with exercises and continue to work towards greater endurace during activities in variety of positions.      PT Treatment/Interventions  ADLs/Self Care Home Management;Biofeedback;Therapeutic activities;Therapeutic exercise;Neuromuscular re-education;Patient/family education;Manual techniques;Passive range of motion;Dry needling;Taping    PT Next Visit Plan  strength and endurance in variety of positions, standing and single leg pallof series, quadruped, side plank     Consulted and Agree with Plan of Care  Patient       Patient will benefit from skilled therapeutic intervention in order to improve the following deficits and impairments:  Pain, Increased muscle spasms, Decreased strength, Decreased coordination  Visit Diagnosis: Muscle weakness (generalized)  Unspecified lack of coordination  Other muscle spasm     Problem List Patient Active Problem List   Diagnosis Date Noted  . History of abnormal Pap smear 02/17/2012  . History of IBS 02/17/2012  . Normal pregnancy 09/13/2010    Vincente PoliJakki Crosser, PT 06/07/2017, 9:19 AM  [facility] [address], [address] [place], [place], [address] Phone: [phone]   Fax:  [phone]  Name: Alyssa Vazquez MRN: [id number] Date of Birth: 1980/12/01

## 2017-06-15 ENCOUNTER — Ambulatory Visit: Payer: PRIVATE HEALTH INSURANCE | Admitting: Physical Therapy

## 2017-06-15 DIAGNOSIS — R279 Unspecified lack of coordination: Secondary | ICD-10-CM

## 2017-06-15 DIAGNOSIS — M6281 Muscle weakness (generalized): Secondary | ICD-10-CM | POA: Diagnosis not present

## 2017-06-15 DIAGNOSIS — M62838 Other muscle spasm: Secondary | ICD-10-CM

## 2017-06-15 NOTE — Therapy (Signed)
[facility] [address] [place], [place], [address] Phone: [phone]   Fax:  [phone]  Physical Therapy Treatment  Patient Details  Name: Alyssa Vazquez MRN: [id number] Date of Birth: 1980/08/26 Referring Provider: Henreitta Leber   Encounter Date: 06/15/2017  PT End of Session - 06/15/17 1240    Visit Number  7    Date for PT Re-Evaluation  07/14/17    PT Start Time  1234    PT Stop Time  1314    PT Time Calculation (min)  40 min    Activity Tolerance  Patient tolerated treatment well    Behavior During Therapy  [facility] for tasks assessed/performed       Past Medical History:  Diagnosis Date  . History of abnormal Pap smear 02/17/2012   Abnormal in 1997; repeats have been normal  . IBS (irritable bowel syndrome)     Past Surgical History:  Procedure Laterality Date  . WISDOM TOOTH EXTRACTION  2001    There were no vitals filed for this visit.  Subjective Assessment - 06/15/17 1237    Subjective  I had a couple of times having urgency.  I did not have any leakage and was coughing a lot which I think put it to the test.    Pertinent History  2 vaginal deliveries    Patient Stated Goals  stop having leakage    Currently in Pain?  No/denies                       [facility] Adult PT Treatment/Exercise - 06/15/17 0001      Neuro Re-ed    Neuro Re-ed Details   focus on bracing TrA and pelvic floor with reduced lumbar lordosis      Lumbar Exercises: Standing   Push / Pull Sled  pallof punch with yellow band; squat with resisted rotation red band - 20x    Row  Strengthening;Both;20 reps;Theraband    Theraband Level (Row)  Level 2 (Red)    Shoulder Extension  Strengthening;Both;20 reps;Theraband    Other Standing Lumbar Exercises  side steping with red band - 2 laps in gym    Other Standing Lumbar Exercises  kneel and half knee with rotation and UE flexion 2lb - 10x each way      Lumbar  Exercises: Quadruped   Opposite Arm/Leg Raise  Right arm/Left leg;Left arm/Right leg;10 reps on green ball             PT Education - 06/15/17 1318    Education provided  Yes    Education Details  Access Code: [email or web address]    Person(s) Educated  Patient    Methods  Explanation;Demonstration;Handout;Verbal cues    Comprehension  Verbalized understanding;Returned demonstration       PT Short Term Goals - 06/07/17 0908      PT SHORT TERM GOAL #2   Title  urinary leakage with laughing/coughing decreased >/= 25%    Baseline  no leakage but still has a lot of urgency    Time  4    Period  Weeks    Status  Achieved        PT Long Term Goals - 06/15/17 1250      PT LONG TERM GOAL #1   Title  independent with HEP    Time  8    Period  Weeks    Status  On-going      PT LONG TERM  GOAL #2   Title  urinary leakage with laughing decreased >/= 80%    Baseline  it seems to be better, easily more than 50%, used to have leakage every time I would cough or laugh or yell - now not happening    Time  8    Period  Weeks    Status  On-going      PT LONG TERM GOAL #3   Title  able to demonstrate >/= to 3/5 pelvic floor contraction with 10 second hold x 5 reps in order to demonstrate endurance needed to run/jump with her children and not experience leakage.    Baseline  able to maintain >16 mV 10 sec x 5 reps    Time  8    Period  Weeks    Status  On-going      PT LONG TERM GOAL #4   Title  pain and tenderness in low back decreased >/= 75%    Baseline  feels back to baseline    Time  8    Period  Weeks    Status  On-going            Plan - 06/15/17 1325    Clinical Impression Statement  Pt has made progress towards goals without any leakage and experienced some instances where she could reduce the urgency so not having to run to the bathroom. Pt was able to perform exercises correctly with some cues to align pelvis for reduction of lumbar lordosis and muscle tightness.  Pt  will coninue to benefit from skilled PT to work on core and pelvic floor strength.  She was recommended to reduce visits to biweekly in order to transition to HEP and assess how she is doing on her own with exercises.    PT Treatment/Interventions  ADLs/Self Care Home Management;Biofeedback;Therapeutic activities;Therapeutic exercise;Neuromuscular re-education;Patient/family education;Manual techniques;Passive range of motion;Dry needling;Taping    PT Next Visit Plan  strength and endurance in variety of positions, standing and progress single leg pallof series, quadruped, side plank    Consulted and Agree with Plan of Care  Patient       Patient will benefit from skilled therapeutic intervention in order to improve the following deficits and impairments:  Pain, Increased muscle spasms, Decreased strength, Decreased coordination  Visit Diagnosis: Muscle weakness (generalized)  Unspecified lack of coordination  Other muscle spasm     Problem List Patient Active Problem List   Diagnosis Date Noted  . History of abnormal Pap smear 02/17/2012  . History of IBS 02/17/2012  . Normal pregnancy 09/13/2010    Vincente PoliJakki Crosser , PT 06/15/2017, 1:31 PM  [facility] [address], [address] [place], [place], [address] Phone: [phone]   Fax:  [phone]  Name: Dale DurhamJaimee T Maragh MRN: [id number] Date of Birth: [birth year]

## 2017-06-15 NOTE — Patient Instructions (Signed)
Access Code: [email or web address]  URL: [email or web address]  Date: 06/15/2017  Prepared by: Dorie RankJacqueline Crosser   Exercises  Quadruped Cat with Posterior Pelvic Tilt - 10 reps - 3 sets - 1x daily - 7x weekly  Bird Dog on Whole FoodsSwiss Ball - 10 reps - 3 sets - 1x daily - 7x weekly  Half Kneeling Diagonal Lift with Narrow Balance - 10 reps - 3 sets - 1x daily - 7x weekly  Half Kneeling Anti-Rotation Press - Forward Leg Opposite Anchor Side - 10 reps - 3 sets - 1x daily - 7x weekly  Side Stepping with Resistance at Ankles - 10 reps - 3 sets - 1x daily - 7x weekly  Shoulder Extension with Resistance - 10 reps - 3 sets - 1x daily - 7x weekly

## 2017-06-23 ENCOUNTER — Encounter: Payer: PRIVATE HEALTH INSURANCE | Admitting: Physical Therapy

## 2017-06-29 ENCOUNTER — Ambulatory Visit: Payer: PRIVATE HEALTH INSURANCE | Admitting: Physical Therapy

## 2017-06-29 ENCOUNTER — Encounter: Payer: Self-pay | Admitting: Physical Therapy

## 2017-06-29 DIAGNOSIS — M6281 Muscle weakness (generalized): Secondary | ICD-10-CM | POA: Diagnosis not present

## 2017-06-29 DIAGNOSIS — R279 Unspecified lack of coordination: Secondary | ICD-10-CM

## 2017-06-29 DIAGNOSIS — M62838 Other muscle spasm: Secondary | ICD-10-CM

## 2017-06-29 NOTE — Therapy (Signed)
[facility] [address], [address] [place], [place], [address] Phone: [phone]   Fax:  [phone]  Physical Therapy Treatment  Patient Details  Name: Alyssa Vazquez MRN: [id number] Date of Birth: 09-24-1980 Referring Provider: Henreitta Leber   Encounter Date: 06/29/2017  PT End of Session - 06/29/17 0850    Visit Number  8    Date for PT Re-Evaluation  07/14/17    PT Start Time  0805    PT Stop Time  0848    PT Time Calculation (min)  43 min    Activity Tolerance  Patient tolerated treatment well    Behavior During Therapy  [facility] for tasks assessed/performed       Past Medical History:  Diagnosis Date  . History of abnormal Pap smear 02/17/2012   Abnormal in 1997; repeats have been normal  . IBS (irritable bowel syndrome)     Past Surgical History:  Procedure Laterality Date  . WISDOM TOOTH EXTRACTION  2001    There were no vitals filed for this visit.  Subjective Assessment - 06/29/17 0815    Subjective  My back has been hurting.  It has been up to a 6 and I can't have intercourse because    Patient Stated Goals  stop having leakage    Currently in Pain?  Yes    Pain Score  3     Pain Location  Back    Pain Orientation  Right;Left;Mid;Lower    Pain Descriptors / Indicators  Aching    Pain Type  Chronic pain    Pain Onset  More than a month ago    Pain Frequency  Intermittent    Multiple Pain Sites  No                       [facility] Adult PT Treatment/Exercise - 06/29/17 0001      Self-Care   Self-Care  Other Self-Care Comments    Other Self-Care Comments   education and demo for self massage      Neuro Re-ed    Neuro Re-ed Details   diaphragmatic breathing with relaxation and bulging pelvic floor, sweeping for tactice cues to do circular contraction - contract and hold 10 sec x 5 2/5-3/5 MMT      Manual Therapy   Soft tissue mobilization  bulbocavernosis, ischiocavernosis, transverse  peroneus    Myofascial Release  facial release from anterior to posterior vaginal vault    Internal Pelvic Floor  pt informed and consent given to perform internal soft tissue and fascial release               PT Short Term Goals - 06/07/17 0908      PT SHORT TERM GOAL #2   Title  urinary leakage with laughing/coughing decreased >/= 25%    Baseline  no leakage but still has a lot of urgency    Time  4    Period  Weeks    Status  Achieved        PT Long Term Goals - 06/15/17 1250      PT LONG TERM GOAL #1   Title  independent with HEP    Time  8    Period  Weeks    Status  On-going      PT LONG TERM GOAL #2   Title  urinary leakage with laughing decreased >/= 80%    Baseline  it seems to be better,  easily more than 50%, used to have leakage every time I would cough or laugh or yell - now not happening    Time  8    Period  Weeks    Status  On-going      PT LONG TERM GOAL #3   Title  able to demonstrate >/= to 3/5 pelvic floor contraction with 10 second hold x 5 reps in order to demonstrate endurance needed to run/jump with her children and not experience leakage.    Baseline  able to maintain >16 mV 10 sec x 5 reps    Time  8    Period  Weeks    Status  On-going      PT LONG TERM GOAL #4   Title  pain and tenderness in low back decreased >/= 75%    Baseline  feels back to baseline    Time  8    Period  Weeks    Status  On-going            Plan - 06/29/17 [id number]    Clinical Impression Statement  Pt has been experiencing more pain in low back.  Pt has high tone pelvic floor today.  She has not been able to have intercourse due to pain at posterior forchette.  Pt was able to relax pelvic floor with diphragmatic breathing techniques.  She was able to demonstrate improved endurance of pelvic floor with 5x 10 sec hold.  Pt needed cues to relax pelvic floor after contracting in order to decrease tone and improve contraction of pelvic floor muscles.  Pt will  benefit from skilled PT to continue to work on muscle coordination and strength for improved function and pelvic floor stabilty.    PT Treatment/Interventions  ADLs/Self Care Home Management;Biofeedback;Therapeutic activities;Therapeutic exercise;Neuromuscular re-education;Patient/family education;Manual techniques;Passive range of motion;Dry needling;Taping    PT Next Visit Plan  sitting on ball and circle, breathing, relaxing, internal STM and myofascial release.    Consulted and Agree with Plan of Care  Patient       Patient will benefit from skilled therapeutic intervention in order to improve the following deficits and impairments:  Pain, Increased muscle spasms, Decreased strength, Decreased coordination  Visit Diagnosis: Muscle weakness (generalized)  Unspecified lack of coordination  Other muscle spasm     Problem List Patient Active Problem List   Diagnosis Date Noted  . History of abnormal Pap smear 02/17/2012  . History of IBS 02/17/2012  . Normal pregnancy 09/13/2010    Vincente PoliJakki Crosser, PT 06/29/2017, 9:30 AM  [facility] [address], [address] [place], [place], [address] Phone: [phone]   Fax:  [phone]  Name: Alyssa Vazquez MRN: [id number] Date of Birth: 08/28/1980

## 2017-06-29 NOTE — Patient Instructions (Signed)
Moisturizers . They are used in the vagina to hydrate the mucous membrane that make up the vaginal canal. . Designed to keep a more normal acid balance (ph) . Once placed in the vagina, it will last between two to three days.  . Use 2-3 times per week at bedtime and last longer than 60 min. . Ingredients to avoid is glycerin and fragrance, can increase chance of infection . Should not be used just before sex due to causing irritation . Most are gels administered either in a tampon-shaped applicator or as a vaginal suppository. They are non-hormonal.   Types of Moisturizers . Leatrice JewelsLuvena- drug store . Vitamin E vaginal suppositories- Whole foods, Amazon . Moist Again . Coconut oil- can break down condoms . Karlton LemonJulva- amazon . Yes moisturizer- amazon . NeuEve Silk , NeuEve Silver for menopausal or over 65 (if have severe vaginal atrophy or cancer treatments use NeuEve Silk for  1 month than move to Home DepoteuEve Silver)- Dana Corporationmazon, ShapeConsultant.com.cyNeuve.com . Olive and Bee intimate cream- www.oliveandbee.com.au  Creams to use externally on the Vulva area  Marathon OilDesert Harvest Releveum (good for for cancer patients that had radiation to the area)- Guamamazon or Newell Rubbermaidwww.https://garcia-valdez.org/desertharvest.com  V-magic cream - amazon  Julva-amazon  Vital "V Wild Yam salve ( help moisturize and help with thinning vulvar area, does have Beeswax  The KrogerMoodMaid Botanical Pro-Meno Wild Yam Cream- Energy East Corporationmazon  Desert Harvest Gele   Things to avoid in the vaginal area . Do not use things to irritate the vulvar area . No lotions just specialized creams for the vulva area- Neogyn, V-magic, No soaps; can use Aveeno or Calendula cleanser if needed. Must be gentle . No deodorants . No douches . Good to sleep without underwear to let the vaginal area to air out . No scrubbing: spread the lips to let warm water rinse over labias and pat dry   STRETCHING THE PELVIC FLOOR MUSCLES NO DILATOR  Supplies . Vaginal lubricant . Mirror (optional) . Gloves  (optional) Positioning . Start in a semi-reclined position with your head propped up. Bend your knees and place your thumb or finger at the vaginal opening. Procedure . Apply a moderate amount of lubricant on the outer skin of your vagina, the labia minora.  Apply additional lubricant to your finger. Marland Kitchen. Spread the skin away from the vaginal opening. Place the end of your finger at the opening. . Do a maximum contraction of the pelvic floor muscles. Tighten the vagina and the anus maximally and relax. . When you know they are relaxed, gently and slowly insert your finger into your vagina, directing your finger slightly downward, for 2-3 inches of insertion. . Relax and stretch the 6 o'clock position . Hold each stretch for _1-2 min__ and repeat __1_ time with rest breaks of _1__ seconds between each stretch. . Repeat the stretching in the 4 o'clock and 8 o'clock positions. . Total time should be _6__ minutes, _1__ x per day.  Note the amount of theme your were able to achieve and your tolerance to your finger in your vagina. . Once you have accomplished the techniques you may try them in standing with one foot resting on the tub, or in other positions.  This is a good stretch to do in the shower if you don't need to use lubricant.   [facility] [address], [address] [place], [place] [address] Phone # [phone] Fax [phone]

## 2017-07-06 ENCOUNTER — Ambulatory Visit: Payer: PRIVATE HEALTH INSURANCE | Admitting: Physical Therapy

## 2017-07-06 ENCOUNTER — Encounter: Payer: Self-pay | Admitting: Physical Therapy

## 2017-07-06 DIAGNOSIS — R279 Unspecified lack of coordination: Secondary | ICD-10-CM

## 2017-07-06 DIAGNOSIS — M62838 Other muscle spasm: Secondary | ICD-10-CM

## 2017-07-06 DIAGNOSIS — M6281 Muscle weakness (generalized): Secondary | ICD-10-CM | POA: Diagnosis not present

## 2017-07-06 NOTE — Therapy (Signed)
[facility] [address], [address] [place], [place], [address] Phone: [phone]   Fax:  [phone]  Physical Therapy Treatment  Patient Details  Name: Alyssa Vazquez MRN: [id number] Date of Birth: [birth year] Referring Provider: Henreitta Leber   Encounter Date: 07/06/2017  PT End of Session - 07/06/17 0809    Visit Number  9    Date for PT Re-Evaluation  07/14/17    PT Start Time  0809 pt arrived 9 minutes late    PT Stop Time  0843    PT Time Calculation (min)  34 min    Activity Tolerance  Patient tolerated treatment well    Behavior During Therapy  [facility] for tasks assessed/performed       Past Medical History:  Diagnosis Date  . History of abnormal Pap smear 02/17/2012   Abnormal in 1997; repeats have been normal  . IBS (irritable bowel syndrome)     Past Surgical History:  Procedure Laterality Date  . WISDOM TOOTH EXTRACTION  2001    There were no vitals filed for this visit.  Subjective Assessment - 07/06/17 0929    Subjective  I have not been doing the exercises because I feel like it is still tight.  I got the v-magic which has helped a lot.      Pertinent History  2 vaginal deliveries    Patient Stated Goals  stop having leakage    Currently in Pain?  No/denies                       [facility] Adult PT Treatment/Exercise - 07/06/17 0001      Neuro Re-ed    Neuro Re-ed Details   sitting on ball, bouncing and side bend, supine LE stretches against wall with breathing, breathingand bulging tactile feedback      Manual Therapy   Soft tissue mobilization  bulbocavernosis, ischiocavernosis, transverse peroneus    Myofascial Release  facial release from anterior to posterior vaginal vault    Internal Pelvic Floor  pt informed and consent given to perform internal soft tissue and fascial release               PT Short Term Goals - 06/07/17 0908      PT SHORT TERM GOAL #2   Title   urinary leakage with laughing/coughing decreased >/= 25%    Baseline  no leakage but still has a lot of urgency    Time  4    Period  Weeks    Status  Achieved        PT Long Term Goals - 07/06/17 0930      PT LONG TERM GOAL #1   Title  independent with HEP    Time  8    Period  Weeks    Status  On-going      PT LONG TERM GOAL #2   Title  urinary leakage with laughing decreased >/= 80%    Time  8    Period  Weeks    Status  On-going      PT LONG TERM GOAL #3   Title  able to demonstrate >/= to 3/5 pelvic floor contraction with 10 second hold x 5 reps in order to demonstrate endurance needed to run/jump with her children and not experience leakage.    Time  8    Period  Weeks    Status  On-going      PT LONG  TERM GOAL #4   Title  pain and tenderness in low back decreased >/= 75%    Time  8    Period  Weeks    Status  On-going            Plan - 07/06/17 1008    Clinical Impression Statement  Pt responded well to manual and stretches.  She needs cues and tactile feedback to know how to relax mucles and to remind her of when she is tightening.  pt will benefit from skilled PT for improved muscle coordination and downtraiing.    PT Treatment/Interventions  ADLs/Self Care Home Management;Biofeedback;Therapeutic activities;Therapeutic exercise;Neuromuscular re-education;Patient/family education;Manual techniques;Passive range of motion;Dry needling;Taping    PT Next Visit Plan  sitting on ball and circle, breathing, relaxing, internal STM and myofascial release, biofeedback for relaxation with stretches    PT Home Exercise Plan  add stretches    Consulted and Agree with Plan of Care  Patient       Patient will benefit from skilled therapeutic intervention in order to improve the following deficits and impairments:  Pain, Increased muscle spasms, Decreased strength, Decreased coordination  Visit Diagnosis: Muscle weakness (generalized)  Unspecified lack of  coordination  Other muscle spasm     Problem List Patient Active Problem List   Diagnosis Date Noted  . History of abnormal Pap smear 02/17/2012  . History of IBS 02/17/2012  . Normal pregnancy 09/13/2010    Vincente Poli, PT 07/06/2017, 10:10 AM  [facility] [address] [place], [place], [address] Phone: [phone]   Fax:  [phone]  Name: Alyssa Vazquez MRN: [id number] Date of Birth: 18-Mar-1980

## 2017-07-06 NOTE — Patient Instructions (Signed)
Access Code: [email or web address]  URL: [email or web address]  Date: 07/06/2017  Prepared by: Dorie Rank   Exercises  Quadruped Cat with Posterior Pelvic Tilt - 10 reps - 3 sets - 1x daily - 7x weekly  Bird Dog on [employer] - 10 reps - 3 sets - 1x daily - 7x weekly  Half Kneeling Diagonal Lift with Narrow Balance - 10 reps - 3 sets - 1x daily - 7x weekly  Half Kneeling Anti-Rotation Press - Forward Leg Opposite Anchor Side - 10 reps - 3 sets - 1x daily - 7x weekly  Side Stepping with Resistance at Ankles - 10 reps - 3 sets - 1x daily - 7x weekly  Shoulder Extension with Resistance - 10 reps - 3 sets - 1x daily - 7x weekly  Supine Pelvic Floor Stretch - 10 reps - 1 sets - 30 hold - 1x daily - 7x weekly  Seated Lateral Trunk Stretch on Swiss Ball - 10 reps - 2 sets - 5 hold - 1x daily - 7x weekly   [facility] [address] [place], [place] [address] Phone # [phone] Fax [phone]

## 2017-07-09 ENCOUNTER — Other Ambulatory Visit: Payer: Self-pay | Admitting: Obstetrics and Gynecology

## 2017-07-13 ENCOUNTER — Ambulatory Visit: Payer: PRIVATE HEALTH INSURANCE | Admitting: Physical Therapy

## 2017-07-20 ENCOUNTER — Ambulatory Visit: Payer: PRIVATE HEALTH INSURANCE | Attending: Obstetrics and Gynecology | Admitting: Physical Therapy

## 2017-07-20 DIAGNOSIS — M62838 Other muscle spasm: Secondary | ICD-10-CM | POA: Insufficient documentation

## 2017-07-20 DIAGNOSIS — R279 Unspecified lack of coordination: Secondary | ICD-10-CM | POA: Insufficient documentation

## 2017-07-20 DIAGNOSIS — M6281 Muscle weakness (generalized): Secondary | ICD-10-CM | POA: Diagnosis present

## 2017-07-20 NOTE — Therapy (Signed)
[facility] [address], [place], [place], [address] Phone: [phone]   Fax:  [phone]  Physical Therapy Treatment Progress Note Reporting Period 05/19/17 to 07/20/17   See note below for Objective Data and Assessment of Progress/Goals.      Patient Details  Name: Alyssa Vazquez MRN: [id number] Date of Birth: 01-19-1981 Referring Provider: Earnstine Regal   Encounter Date: 07/20/2017  PT End of Session - 07/20/17 0849    Visit Number  10    Date for PT Re-Evaluation  08/31/17    Authorization Type  medcost    PT Start Time  0812 arrived late    PT Stop Time  0845    PT Time Calculation (min)  33 min    Activity Tolerance  Patient tolerated treatment well    Behavior During Therapy  [facility] for tasks assessed/performed       Past Medical History:  Diagnosis Date  . History of abnormal Pap smear 02/17/2012   Abnormal in 1997; repeats have been normal  . IBS (irritable bowel syndrome)     Past Surgical History:  Procedure Laterality Date  . WISDOM TOOTH EXTRACTION  2001    There were no vitals filed for this visit.  Subjective Assessment - 07/20/17 0844    Subjective  I am not having any leakage.  I am still having pain with intercourse and can't get to the some of the muscles or trigger points doing the self massage    Pertinent History  2 vaginal deliveries    Patient Stated Goals  stop having leakage    Currently in Pain?  No/denies                       [facility] Adult PT Treatment/Exercise - 07/20/17 0001      Manual Therapy   Manual therapy comments  pt informed and consent given to perform internal soft tissue work    Soft tissue mobilization  bulbocavernosis, ischiocavernosis, transverse peroneus, bilateral adductors       Trigger Point Dry Needling - 07/20/17 0905    Muscles Treated Lower Body  Adductor longus/brevius/maximus    Adductor Response  Twitch response  elicited;Palpable increased muscle length             PT Short Term Goals - 06/07/17 0908      PT SHORT TERM GOAL #2   Title  urinary leakage with laughing/coughing decreased >/= 25%    Baseline  no leakage but still has a lot of urgency    Time  4    Period  Weeks    Status  Achieved        PT Long Term Goals - 07/20/17 1041      PT LONG TERM GOAL #1   Title  independent with HEP    Time  6    Period  Weeks    Status  On-going    Target Date  08/31/17      PT LONG TERM GOAL #2   Title  urinary leakage with laughing decreased >/= 80%    Status  Achieved      PT LONG TERM GOAL #3   Status  Achieved      PT LONG TERM GOAL #4   Title  pain and tenderness in low back decreased >/= 75%    Time  6    Period  Weeks    Status  On-going  Target Date  08/31/17      PT LONG TERM GOAL #5   Title  Pt will be 1/3 on Marinoff scale and be able to manage pain with self massage and breathing techniques    Time  6    Period  Weeks    Status  New    Target Date  08/31/17      Additional Long Term Goals   Additional Long Term Goals  Yes      PT LONG TERM GOAL #6   Title  Pt will have no palpable trigger points in transverse peroneus or coccygeus in order to perform functional activities without pain    Time  6    Period  Weeks    Status  New    Target Date  08/31/17            Plan - 07/20/17 0910    Clinical Impression Statement  Patient has met goals for not having leakage.  Pt is still having muscle spasms and pain with intercourse.  She is still making progress with HEP and self massage, but has some muscle spasms that continue to persist at transverse peroneus, coccygeus.  Pt will benefit from skilled PT to address remaining funcitonal impairments.    PT Frequency  1x / week    PT Duration  6 weeks    PT Treatment/Interventions  ADLs/Self Care Home Management;Biofeedback;Therapeutic activities;Therapeutic exercise;Neuromuscular re-education;Patient/family  education;Manual techniques;Passive range of motion;Dry needling;Taping    PT Next Visit Plan  STM to adductors, TP, LA, coccygeus, fascial release at posterior forchette    PT Home Exercise Plan  add stretches    Consulted and Agree with Plan of Care  Patient       Patient will benefit from skilled therapeutic intervention in order to improve the following deficits and impairments:  Pain, Increased muscle spasms, Decreased strength, Decreased coordination  Visit Diagnosis: Muscle weakness (generalized)  Unspecified lack of coordination  Other muscle spasm     Problem List Patient Active Problem List   Diagnosis Date Noted  . History of abnormal Pap smear 02/17/2012  . History of IBS 02/17/2012  . Normal pregnancy 09/13/2010    Zannie Cove, PT 07/20/2017, 1:24 PM  [facility] [address] [place], [place], [address] Phone: [phone]   Fax:  [phone]  Name: Alyssa Vazquez MRN: [id number] Date of Birth: [birth year]

## 2017-07-28 ENCOUNTER — Ambulatory Visit: Payer: PRIVATE HEALTH INSURANCE | Admitting: Physical Therapy

## 2017-07-28 DIAGNOSIS — R279 Unspecified lack of coordination: Secondary | ICD-10-CM

## 2017-07-28 DIAGNOSIS — M62838 Other muscle spasm: Secondary | ICD-10-CM

## 2017-07-28 DIAGNOSIS — M6281 Muscle weakness (generalized): Secondary | ICD-10-CM | POA: Diagnosis not present

## 2017-07-28 NOTE — Therapy (Signed)
[facility] [address] [place], [place], [address] Phone: [phone]   Fax:  [phone]  Physical Therapy Treatment  Patient Details  Name: Alyssa Vazquez MRN: [id number] Date of Birth: [birth year] Referring Provider: Henreitta Leber   Encounter Date: 07/28/2017  PT End of Session - 07/28/17 0905    Visit Number  11    Date for PT Re-Evaluation  08/31/17    Authorization Type  medcost    PT Start Time  0806    PT Stop Time  0846    PT Time Calculation (min)  40 min    Activity Tolerance  Patient tolerated treatment well    Behavior During Therapy  [facility] for tasks assessed/performed       Past Medical History:  Diagnosis Date  . History of abnormal Pap smear 02/17/2012   Abnormal in 1997; repeats have been normal  . IBS (irritable bowel syndrome)     Past Surgical History:  Procedure Laterality Date  . WISDOM TOOTH EXTRACTION  2001    There were no vitals filed for this visit.  Subjective Assessment - 07/28/17 0849    Subjective  Pt reports no leakage and she has had a cough.  Pt states she is doing breathing and relaxing throughout the day.  She states she had intercourse 2x and had pain with orgasm that did not last after her partner pulled out.    Pertinent History  2 vaginal deliveries    Currently in Pain?  No/denies                       [facility] Adult PT Treatment/Exercise - 07/28/17 0001      Neuro Re-ed    Neuro Re-ed Details   sitting on ball bouncing and breathing, bulging tactile feedback      Manual Therapy   Manual therapy comments  pt informed and consent given to perform internal soft tissue work    Soft tissue mobilization  bulbocavernosis, ischiocavernosis, transverse peroneus, bilateral adductors, levator ani (left side more tender with trigger points               PT Short Term Goals - 06/07/17 0908      PT SHORT TERM GOAL #2   Title  urinary leakage with  laughing/coughing decreased >/= 25%    Baseline  no leakage but still has a lot of urgency    Time  4    Period  Weeks    Status  Achieved        PT Long Term Goals - 07/28/17 0905      PT LONG TERM GOAL #1   Title  independent with HEP    Status  On-going      PT LONG TERM GOAL #2   Title  urinary leakage with laughing decreased >/= 80%    Status  Achieved      PT LONG TERM GOAL #3   Title  able to demonstrate >/= to 3/5 pelvic floor contraction with 10 second hold x 5 reps in order to demonstrate endurance needed to run/jump with her children and not experience leakage.    Status  Achieved      PT LONG TERM GOAL #4   Title  pain and tenderness in low back decreased >/= 75%    Status  On-going      PT LONG TERM GOAL #5   Title  Pt will be 1/3 on [facility]  scale and be able to manage pain with self massage and breathing techniques    Baseline  had to stop with orgasm and uncomfortable along urethra, overall improving    Status  On-going      PT LONG TERM GOAL #6   Title  Pt will have no palpable trigger points in transverse peroneus or coccygeus in order to perform functional activities without pain    Status  On-going            Plan - 07/28/17 0907    Clinical Impression Statement  Pt has trigger point more on left than right.  She is able to relax transverse peroneus muscle with breathing  but tightens up when she experiences pain with trigger point release.  Pt is doing well with breathing throughout the day and continues to not experience leakage.  Pt is improving with soft tissue work to release trigger points.  She will benefit from skilled PT to continue to address soft tissue impairments so she can return to normal functional aciviites.    PT Treatment/Interventions  ADLs/Self Care Home Management;Biofeedback;Therapeutic activities;Therapeutic exercise;Neuromuscular re-education;Patient/family education;Manual techniques;Passive range of motion;Dry needling;Taping     PT Next Visit Plan  STM to adductors, TP, LA, coccygeus, fascial release at posterior forchette, discuss isometric contracing with vibrator or dilator    Consulted and Agree with Plan of Care  Patient       Patient will benefit from skilled therapeutic intervention in order to improve the following deficits and impairments:  Pain, Increased muscle spasms, Decreased strength, Decreased coordination  Visit Diagnosis: Muscle weakness (generalized)  Unspecified lack of coordination  Other muscle spasm     Problem List Patient Active Problem List   Diagnosis Date Noted  . History of abnormal Pap smear 02/17/2012  . History of IBS 02/17/2012  . Normal pregnancy 09/13/2010    Vincente Poli 07/28/2017, 9:10 AM  [facility] [address], [address] [place], [place], [address] Phone: [phone]   Fax:  [phone]  Name: Alyssa Vazquez MRN: [id number] Date of Birth: 10-09-1980

## 2017-08-05 ENCOUNTER — Ambulatory Visit: Payer: PRIVATE HEALTH INSURANCE | Admitting: Physical Therapy

## 2017-08-05 DIAGNOSIS — R279 Unspecified lack of coordination: Secondary | ICD-10-CM

## 2017-08-05 DIAGNOSIS — M6281 Muscle weakness (generalized): Secondary | ICD-10-CM

## 2017-08-05 DIAGNOSIS — M62838 Other muscle spasm: Secondary | ICD-10-CM

## 2017-08-05 NOTE — Therapy (Signed)
[facility] [address] [place], [place], [address] Phone: [phone]   Fax:  [phone]  Physical Therapy Treatment  Patient Details  Name: Alyssa Vazquez MRN: [id number] Date of Birth: Apr 19, 1980 Referring Provider: Henreitta Leber   Encounter Date: 08/05/2017  PT End of Session - 08/05/17 0816    Visit Number  12    Date for PT Re-Evaluation  08/31/17    Authorization Type  medcost    PT Start Time  0809 late    PT Stop Time  0845    PT Time Calculation (min)  36 min    Activity Tolerance  Patient tolerated treatment well    Behavior During Therapy  [facility] for tasks assessed/performed       Past Medical History:  Diagnosis Date  . History of abnormal Pap smear 02/17/2012   Abnormal in 1997; repeats have been normal  . IBS (irritable bowel syndrome)     Past Surgical History:  Procedure Laterality Date  . WISDOM TOOTH EXTRACTION  2001    There were no vitals filed for this visit.  Subjective Assessment - 08/05/17 0813    Subjective  Pt states she was able to get to a spot with the vibrator for a good stretch.  States she is having aching after intercourse    Currently in Pain?  No/denies                       [facility] Adult PT Treatment/Exercise - 08/05/17 0001      Manual Therapy   Manual therapy comments  pt informed and consent given to perform internal soft tissue work    Soft tissue mobilization  bulbocavernosis, ischiocavernosis, transverse peroneus, bilateral adductors, levator ani, glutes    Internal Pelvic Floor  in sidelying and supine               PT Short Term Goals - 06/07/17 0908      PT SHORT TERM GOAL #2   Title  urinary leakage with laughing/coughing decreased >/= 25%    Baseline  no leakage but still has a lot of urgency    Time  4    Period  Weeks    Status  Achieved        PT Long Term Goals - 07/28/17 6962      PT LONG TERM GOAL #1   Title   independent with HEP    Status  On-going      PT LONG TERM GOAL #2   Title  urinary leakage with laughing decreased >/= 80%    Status  Achieved      PT LONG TERM GOAL #3   Title  able to demonstrate >/= to 3/5 pelvic floor contraction with 10 second hold x 5 reps in order to demonstrate endurance needed to run/jump with her children and not experience leakage.    Status  Achieved      PT LONG TERM GOAL #4   Title  pain and tenderness in low back decreased >/= 75%    Status  On-going      PT LONG TERM GOAL #5   Title  Pt will be 1/3 on Marinoff scale and be able to manage pain with self massage and breathing techniques    Baseline  had to stop with orgasm and uncomfortable along urethra, overall improving    Status  On-going      PT LONG TERM GOAL #6  Title  Pt will have no palpable trigger points in transverse peroneus or coccygeus in order to perform functional activities without pain    Status  On-going            Plan - 08/05/17 0853    Clinical Impression Statement  Pt has made some progress with self massage techniques using vibrator at home.  She is able to have intercourse but still having pain at posterior forchette and having deep ache on the right side after orgasm occurs.  Pt had less pain to palpation today and able to tolerate more pressure.  She continues to benefit fromskilled PT to improved soft tissue length and function.    PT Treatment/Interventions  ADLs/Self Care Home Management;Biofeedback;Therapeutic activities;Therapeutic exercise;Neuromuscular re-education;Patient/family education;Manual techniques;Passive range of motion;Dry needling;Taping    PT Next Visit Plan  STM to adductors, TP, LA, coccygeus, fascial release at posterior forchette, discuss isometric contracing with vibrator or dilator    Consulted and Agree with Plan of Care  Patient       Patient will benefit from skilled therapeutic intervention in order to improve the following deficits and  impairments:  Pain, Increased muscle spasms, Decreased strength, Decreased coordination  Visit Diagnosis: Muscle weakness (generalized)  Unspecified lack of coordination  Other muscle spasm     Problem List Patient Active Problem List   Diagnosis Date Noted  . History of abnormal Pap smear 02/17/2012  . History of IBS 02/17/2012  . Normal pregnancy 09/13/2010    Vincente Poli 08/05/2017, 8:59 AM  [facility] [address], [address] [place], [place], [address] Phone: [phone]   Fax:  [phone]  Name: Alyssa Vazquez MRN: [id number] Date of Birth: [birth year]

## 2017-08-10 ENCOUNTER — Ambulatory Visit: Payer: PRIVATE HEALTH INSURANCE | Attending: Obstetrics and Gynecology | Admitting: Physical Therapy

## 2017-08-10 DIAGNOSIS — R279 Unspecified lack of coordination: Secondary | ICD-10-CM | POA: Insufficient documentation

## 2017-08-10 DIAGNOSIS — M62838 Other muscle spasm: Secondary | ICD-10-CM | POA: Insufficient documentation

## 2017-08-10 DIAGNOSIS — M6281 Muscle weakness (generalized): Secondary | ICD-10-CM | POA: Insufficient documentation

## 2017-08-10 NOTE — Therapy (Signed)
[facility] [address], [address] [place], [place], [address] Phone: [phone]   Fax:  [phone]  Physical Therapy Treatment  Patient Details  Name: Alyssa Vazquez MRN: [id number] Date of Birth: [birth year] Referring Provider: Henreitta Leber   Encounter Date: 08/10/2017  PT End of Session - 08/10/17 1317    Visit Number  13    Date for PT Re-Evaluation  08/31/17    Authorization Type  medcost    PT Start Time  0808    PT Stop Time  0845    PT Time Calculation (min)  37 min    Activity Tolerance  Patient tolerated treatment well    Behavior During Therapy  [facility] for tasks assessed/performed       Past Medical History:  Diagnosis Date  . History of abnormal Pap smear 02/17/2012   Abnormal in 1997; repeats have been normal  . IBS (irritable bowel syndrome)     Past Surgical History:  Procedure Laterality Date  . WISDOM TOOTH EXTRACTION  2001    There were no vitals filed for this visit.  Subjective Assessment - 08/10/17 1231    Subjective  Pt states she has not had any pain, but hasn't had intercourse since previous reported.  She was lifitng wood this weekend and did have some pain in hip/buttocks area.    Pertinent History  2 vaginal deliveries    Patient Stated Goals  stop having leakage    Currently in Pain?  No/denies                       [facility] Adult PT Treatment/Exercise - 08/10/17 0001      Self-Care   Other Self-Care Comments   educated in performing the isometric vaginal cotraction with dilator - do 3x 10 per day      Manual Therapy   Myofascial Release  external to ischiocavernosis, bulbocav, TP, adductors               PT Short Term Goals - 06/07/17 0908      PT SHORT TERM GOAL #2   Title  urinary leakage with laughing/coughing decreased >/= 25%    Baseline  no leakage but still has a lot of urgency    Time  4    Period  Weeks    Status  Achieved        PT Long  Term Goals - 07/28/17 0905      PT LONG TERM GOAL #1   Title  independent with HEP    Status  On-going      PT LONG TERM GOAL #2   Title  urinary leakage with laughing decreased >/= 80%    Status  Achieved      PT LONG TERM GOAL #3   Title  able to demonstrate >/= to 3/5 pelvic floor contraction with 10 second hold x 5 reps in order to demonstrate endurance needed to run/jump with her children and not experience leakage.    Status  Achieved      PT LONG TERM GOAL #4   Title  pain and tenderness in low back decreased >/= 75%    Status  On-going      PT LONG TERM GOAL #5   Title  Pt will be 1/3 on Marinoff scale and be able to manage pain with self massage and breathing techniques    Baseline  had to stop with orgasm and uncomfortable along urethra,  overall improving    Status  On-going      PT LONG TERM GOAL #6   Title  Pt will have no palpable trigger points in transverse peroneus or coccygeus in order to perform functional activities without pain    Status  On-going            Plan - 08/10/17 1235    Clinical Impression Statement  Pt was able to achieve greater soft tissue length and reduced trigger points along adductors, ischiocavernosis, and transverse peroneus.  Pt will begin strengthening again.  Pt continues to need skilled PT inorder to be able to be ind with advanced HEP without increased muscle spasms during intercourse and functional activities.      PT Treatment/Interventions  ADLs/Self Care Home Management;Biofeedback;Therapeutic activities;Therapeutic exercise;Neuromuscular re-education;Patient/family education;Manual techniques;Passive range of motion;Dry needling;Taping    PT Next Visit Plan  DN to TP, ischiocav and hip flexor, STM to adductors, TP, LA, coccygeus, fascial release at posterior forchette, discuss isometric contracing with vibrator or dilator    Consulted and Agree with Plan of Care  Patient       Patient will benefit from skilled therapeutic  intervention in order to improve the following deficits and impairments:  Pain, Increased muscle spasms, Decreased strength, Decreased coordination  Visit Diagnosis: Muscle weakness (generalized)  Unspecified lack of coordination  Other muscle spasm     Problem List Patient Active Problem List   Diagnosis Date Noted  . History of abnormal Pap smear 02/17/2012  . History of IBS 02/17/2012  . Normal pregnancy 09/13/2010    Vincente PoliJakki Crosser, PT 08/10/2017, 1:24 PM  [facility] [address] [place], [place], [address] Phone: [phone]   Fax:  [phone]  Name: Alyssa Vazquez MRN: [id number] Date of Birth: 01-20-1981

## 2017-08-17 ENCOUNTER — Encounter: Payer: Self-pay | Admitting: Physical Therapy

## 2017-08-17 ENCOUNTER — Ambulatory Visit: Payer: PRIVATE HEALTH INSURANCE | Admitting: Physical Therapy

## 2017-08-17 DIAGNOSIS — M6281 Muscle weakness (generalized): Secondary | ICD-10-CM | POA: Diagnosis not present

## 2017-08-17 DIAGNOSIS — M62838 Other muscle spasm: Secondary | ICD-10-CM

## 2017-08-17 DIAGNOSIS — R279 Unspecified lack of coordination: Secondary | ICD-10-CM

## 2017-08-17 NOTE — Therapy (Signed)
[facility] [address], [address] [place], [place], [address] Phone: [phone]   Fax:  [phone]  Physical Therapy Treatment  Patient Details  Name: Alyssa DurhamJaimee T Syring MRN: [id number] Date of Birth: 1980-11-03 Referring Provider: Henreitta LeberElmira Powell   Encounter Date: 08/17/2017  PT End of Session - 08/17/17 0829    Visit Number  14    Date for PT Re-Evaluation  08/31/17    Authorization Type  medcost    PT Start Time  0750 dry needle    PT Stop Time  0827    PT Time Calculation (min)  37 min    Activity Tolerance  Patient tolerated treatment well    Behavior During Therapy  [facility] for tasks assessed/performed       Past Medical History:  Diagnosis Date  . History of abnormal Pap smear 02/17/2012   Abnormal in 1997; repeats have been normal  . IBS (irritable bowel syndrome)     Past Surgical History:  Procedure Laterality Date  . WISDOM TOOTH EXTRACTION  2001    There were no vitals filed for this visit.  Subjective Assessment - 08/17/17 0815    Subjective  Pt reports feeling a little more sore this last week. Pt has been doing some kegels this week and also had her period so not sure if either of those is the cause    Pertinent History  2 vaginal deliveries    Patient Stated Goals  stop having leakage    Currently in Pain?  No/denies                       [facility] Adult PT Treatment/Exercise - 08/17/17 0001      Neuro Re-ed    Neuro Re-ed Details   contract and relax for greater anterior pelvic floor contraction; breathing and bulging with happy baby and frog squat      Manual Therapy   Manual therapy comments  pt informed and consent given to perform internal soft tissue work    Soft tissue mobilization  bulbocavernosis, ischiocavernosis, transverse peroneus, bilateral adductors, lumbar paraspinals T12-L5       Trigger Point Dry Needling - 08/17/17 0843    Consent Given?  Yes    Muscles Treated  Upper Body  -- T12-L3 multifidi    Muscles Treated Lower Body  -- TP, IC             PT Short Term Goals - 06/07/17 0908      PT SHORT TERM GOAL #2   Title  urinary leakage with laughing/coughing decreased >/= 25%    Baseline  no leakage but still has a lot of urgency    Time  4    Period  Weeks    Status  Achieved        PT Long Term Goals - 08/17/17 0840      PT LONG TERM GOAL #1   Title  independent with HEP    Time  6    Period  Weeks    Status  On-going      PT LONG TERM GOAL #4   Title  pain and tenderness in low back decreased >/= 75%    Time  6    Period  Weeks    Status  On-going      PT LONG TERM GOAL #5   Title  Pt will be 1/3 on Marinoff scale and be able to manage pain with self  massage and breathing techniques    Time  6    Period  Weeks    Status  On-going      PT LONG TERM GOAL #6   Title  Pt will have no palpable trigger points in transverse peroneus or coccygeus in order to perform functional activities without pain    Baseline  was improved after dry needling    Time  6    Period  Weeks    Status  On-going            Plan - 08/17/17 0830    Clinical Impression Statement  Pt had release with manual and dry needling today.  She was able to feel a greater stretch to posterior perineum with stretches especially happy baby.  Pt responded well to cues for contracting anterior pelvic floor more than posterior in prone.  She will benefit from skilled PT to work on improved muscle coordination i norder to learn to contract and relax without increased muscle spasms.    PT Treatment/Interventions  ADLs/Self Care Home Management;Biofeedback;Therapeutic activities;Therapeutic exercise;Neuromuscular re-education;Patient/family education;Manual techniques;Passive range of motion;Dry needling;Taping    PT Next Visit Plan  f/u on DN to TP, ischiocav and hip flexor, STM to adductors, TP, LA, coccygeus, fascial release at posterior forchette, discuss  isometric contracing with vibrator or dilator    Consulted and Agree with Plan of Care  Patient       Patient will benefit from skilled therapeutic intervention in order to improve the following deficits and impairments:  Pain, Increased muscle spasms, Decreased strength, Decreased coordination  Visit Diagnosis: Muscle weakness (generalized)  Unspecified lack of coordination  Other muscle spasm     Problem List Patient Active Problem List   Diagnosis Date Noted  . History of abnormal Pap smear 02/17/2012  . History of IBS 02/17/2012  . Normal pregnancy 09/13/2010    Vincente Poli, PT 08/17/2017, 8:45 AM  [facility] [address] [place], [place], [address] Phone: [phone]   Fax:  [phone]  Name: Alyssa Vazquez MRN: [id number] Date of Birth: 07/29/1980

## 2017-08-31 ENCOUNTER — Ambulatory Visit: Payer: PRIVATE HEALTH INSURANCE | Admitting: Physical Therapy

## 2017-08-31 DIAGNOSIS — M6281 Muscle weakness (generalized): Secondary | ICD-10-CM

## 2017-08-31 DIAGNOSIS — R279 Unspecified lack of coordination: Secondary | ICD-10-CM

## 2017-08-31 DIAGNOSIS — M62838 Other muscle spasm: Secondary | ICD-10-CM

## 2017-08-31 NOTE — Therapy (Addendum)
[facility] [address] [place], [place], [address] Phone: [phone]   Fax:  [phone]  Physical Therapy Treatment  Patient Details  Name: Alyssa Vazquez MRN: [id number] Date of Birth: [birth year] Referring Provider: Henreitta Leber   Encounter Date: 08/31/2017  PT End of Session - 08/31/17 1118    Visit Number  15    Date for PT Re-Evaluation  11/23/17    Authorization Type  medcost    PT Start Time  0804    PT Stop Time  0845    PT Time Calculation (min)  41 min    Activity Tolerance  Patient tolerated treatment well    Behavior During Therapy  [facility] for tasks assessed/performed       Past Medical History:  Diagnosis Date  . History of abnormal Pap smear 02/17/2012   Abnormal in 1997; repeats have been normal  . IBS (irritable bowel syndrome)     Past Surgical History:  Procedure Laterality Date  . WISDOM TOOTH EXTRACTION  2001    There were no vitals filed for this visit.  Subjective Assessment - 08/31/17 0806    Subjective  Pt had very bad skin issue after the dry needling.  She was able to work out some muscle tension but it came back. Still not having any leakage    Pertinent History  2 vaginal deliveries    Patient Stated Goals  stop having leakage    Currently in Pain?  No/denies         [facility] PT Assessment - 08/31/17 0001      Observation/Other Assessments   Focus on Therapeutic Outcomes (FOTO)   UDI-6 = 2/24                   [facility] Adult PT Treatment/Exercise - 08/31/17 0001      Self-Care   Other Self-Care Comments   verbally educated and reviewed HEP during manual treatment      Manual Therapy   Manual therapy comments  pt informed and consent given to perform internal soft tissue work    Soft tissue mobilization  bulbocavernosis, ischiocavernosis, transverse peroneus, levator ani, coccygeus, sacral ligaments, bilateral glutes               PT Short Term  Goals - 06/07/17 0908      PT SHORT TERM GOAL #2   Title  urinary leakage with laughing/coughing decreased >/= 25%    Baseline  no leakage but still has a lot of urgency    Time  4    Period  Weeks    Status  Achieved        PT Long Term Goals - 08/31/17 1126      PT LONG TERM GOAL #1   Title  independent with HEP    Time  12    Period  Weeks    Status  On-going    Target Date  11/23/17      PT LONG TERM GOAL #4   Title  pain and tenderness in low back decreased >/= 25%    Time  12    Period  Weeks    Status  Revised    Target Date  11/23/17      PT LONG TERM GOAL #5   Title  Pt will be 1/3 on Marinoff scale and be able to manage pain with self massage and breathing techniques    Baseline  3/3 many times and  decreased sexual activity    Time  12    Period  Weeks    Status  On-going    Target Date  11/23/17      Additional Long Term Goals   Additional Long Term Goals  Yes      PT LONG TERM GOAL #6   Title  Pt will have no palpable trigger points in transverse peroneus or coccygeus in order to perform functional activities without pain    Time  12    Period  Weeks    Status  On-going      PT LONG TERM GOAL #7   Title  Pt will report minimal pelvic pain during the day and able to manage with HEP if pain arises    Time  12    Period  Weeks    Status  New    Target Date  11/23/17            Plan - 08/31/17 1119    Clinical Impression Statement  Pt has made excellent progress demonstrated by UDI-6 outcome measure and has not had issues with leakage.  Pt was able to demonstrate contracting pelvic floor for 10 seconds with at least 3/5 MMT.  Pt is still having muscle spasms and pain that comes and goes.  She benefit from skilled PT manual therapies to work on reduced muscle spasms.  Pt is still unable to completely manage symptoms independently at this time.  She will benefit from skilled therapy to continue working on increased soft tissue length in order to  return to all functional activites without pain.    Rehab Potential  Excellent    PT Frequency  Biweekly reducing to 1x/month as able    PT Duration  12 weeks    PT Treatment/Interventions  ADLs/Self Care Home Management;Biofeedback;Therapeutic activities;Therapeutic exercise;Neuromuscular re-education;Patient/family education;Manual techniques;Passive range of motion;Dry needling;Taping    PT Next Visit Plan  STM to TP, ischiocav and hip flexor, STM to adductors, TP, LA, coccygeus, fascial release at posterior forchette, discuss isometric contracing with vibrator or dilator    Recommended Other Services  07/20/17 progress; re-cert sent 1/19/146/25/19    Consulted and Agree with Plan of Care  Patient       Patient will benefit from skilled therapeutic intervention in order to improve the following deficits and impairments:  Pain, Increased muscle spasms, Decreased strength, Decreased coordination  Visit Diagnosis: Muscle weakness (generalized) - Plan: PT plan of care cert/re-cert  Unspecified lack of coordination - Plan: PT plan of care cert/re-cert  Other muscle spasm - Plan: PT plan of care cert/re-cert     Problem List Patient Active Problem List   Diagnosis Date Noted  . History of abnormal Pap smear 02/17/2012  . History of IBS 02/17/2012  . Normal pregnancy 09/13/2010    Vincente PoliJakki Crosser, PT 08/31/2017, 11:47 AM  [facility] [address], [address] [place], [place], [address] Phone: [phone]   Fax:  [phone]  Name: Alyssa Vazquez MRN: [id number] Date of Birth: [birth year]

## 2017-09-21 ENCOUNTER — Ambulatory Visit: Payer: PRIVATE HEALTH INSURANCE | Attending: Obstetrics and Gynecology | Admitting: Physical Therapy

## 2017-09-21 DIAGNOSIS — M6281 Muscle weakness (generalized): Secondary | ICD-10-CM | POA: Insufficient documentation

## 2017-09-21 DIAGNOSIS — R279 Unspecified lack of coordination: Secondary | ICD-10-CM | POA: Diagnosis present

## 2017-09-21 DIAGNOSIS — M62838 Other muscle spasm: Secondary | ICD-10-CM

## 2017-09-21 NOTE — Therapy (Addendum)
[facility] [address], [place], [place], [address] Phone: [phone]   Fax:  [phone]  Physical Therapy Treatment  Patient Details  Name: Alyssa Vazquez MRN: [id number] Date of Birth: May 27, 1980 Referring Provider: Earnstine Regal   Encounter Date: 09/21/2017  PT End of Session - 09/21/17 1525    Visit Number  16    Date for PT Re-Evaluation  11/23/17    Authorization Type  medcost    PT Start Time  1446    PT Stop Time  1523    PT Time Calculation (min)  37 min    Activity Tolerance  Patient tolerated treatment well    Behavior During Therapy  [facility] for tasks assessed/performed       Past Medical History:  Diagnosis Date  . History of abnormal Pap smear 02/17/2012   Abnormal in 1997; repeats have been normal  . IBS (irritable bowel syndrome)     Past Surgical History:  Procedure Laterality Date  . WISDOM TOOTH EXTRACTION  2001    There were no vitals filed for this visit.  Subjective Assessment - 09/21/17 1450    Subjective  States things have been better.  No pain with intercourse other than a small amount of discomfort at 6o'clock where the skin issue is.  Pt reports no pain or discomfort and able to adjust position if anything gets tight.    Currently in Pain?  No/denies                       [facility] Adult PT Treatment/Exercise - 09/21/17 0001      Lumbar Exercises: Stretches   Other Lumbar Stretch Exercise  child's pose      Lumbar Exercises: Standing   Other Standing Lumbar Exercises  pallof punches yellow band    Other Standing Lumbar Exercises  squat with wide stance      Lumbar Exercises: Supine   Dead Bug  10 reps    Large Ball Abdominal Isometric  20 reps    Other Supine Lumbar Exercises  knee drop out in hooklying      Lumbar Exercises: Quadruped   Single Arm Raise  10 reps    Straight Leg Raise  10 reps on ball with TrA activation    Other Quadruped Lumbar  Exercises  thoracic rotation    Other Quadruped Lumbar Exercises  circles in quadruped       squats with emphasis on TrA activation and lengthening through lumbar spine - hip width and wide stance        PT Short Term Goals - 06/07/17 0908      PT SHORT TERM GOAL #2   Title  urinary leakage with laughing/coughing decreased >/= 25%    Baseline  no leakage but still has a lot of urgency    Time  4    Period  Weeks    Status  Achieved        PT Long Term Goals - 09/21/17 1528      PT LONG TERM GOAL #1   Title  independent with HEP    Status  On-going      PT LONG TERM GOAL #2   Title  urinary leakage with laughing decreased >/= 80%    Status  Achieved      PT LONG TERM GOAL #3   Title  able to demonstrate >/= to 3/5 pelvic floor contraction with 10 second hold x 5  reps in order to demonstrate endurance needed to run/jump with her children and not experience leakage.    Status  Achieved      PT LONG TERM GOAL #4   Title  pain and tenderness in low back decreased >/= 25%    Status  Achieved      PT LONG TERM GOAL #5   Title  Pt will be 1/3 on Marinoff scale and be able to manage pain with self massage and breathing techniques    Baseline  1/3 to 0/3    Status  Achieved      PT LONG TERM GOAL #6   Title  Pt will have no palpable trigger points in transverse peroneus or coccygeus in order to perform functional activities without pain    Status  Achieved      PT LONG TERM GOAL #7   Title  Pt will report minimal pelvic pain during the day and able to manage with HEP if pain arises    Status  Achieved            Plan - 09/21/17 1525    Clinical Impression Statement  Pt has met most of her long term goals.  She is doing well with HEP . PT will keep chart open so patient will be able to return if issues arise with HEP or increased muscle spasms.     PT Treatment/Interventions  ADLs/Self Care Home Management;Biofeedback;Therapeutic activities;Therapeutic  exercise;Neuromuscular re-education;Patient/family education;Manual techniques;Passive range of motion;Dry needling;Taping    PT Next Visit Plan  most likely discharge unless any flare ups occur    Consulted and Agree with Plan of Care  Patient       Patient will benefit from skilled therapeutic intervention in order to improve the following deficits and impairments:  Pain, Increased muscle spasms, Decreased strength, Decreased coordination  Visit Diagnosis: Muscle weakness (generalized)  Unspecified lack of coordination  Other muscle spasm     Problem List Patient Active Problem List   Diagnosis Date Noted  . History of abnormal Pap smear 02/17/2012  . History of IBS 02/17/2012  . Normal pregnancy 09/13/2010    Zannie Cove, PT 09/21/2017, 5:21 PM  [facility] [address] [place], [place], [address] Phone: [phone]   Fax:  [phone]  Name: Alyssa Vazquez MRN: [id number] Date of Birth: 14-Aug-1980  PHYSICAL THERAPY DISCHARGE SUMMARY  Visits from Start of Care: 16  Current functional level related to goals / functional outcomes: See above   Remaining deficits: See above details   Education / Equipment: HEP  Plan: Patient agrees to discharge.  Patient goals were partially met. Patient is being discharged due to being pleased with the current functional level.  ?????     Google, PT 12/15/17 3:49 PM

## 2018-07-23 IMAGING — CR DG CHEST 2V
2 series · 2 of 2 positions shown · non-contrast
Comparison: None.

CLINICAL DATA: Chest heaviness today

EXAM:
CHEST  2 VIEW

[chest pa]
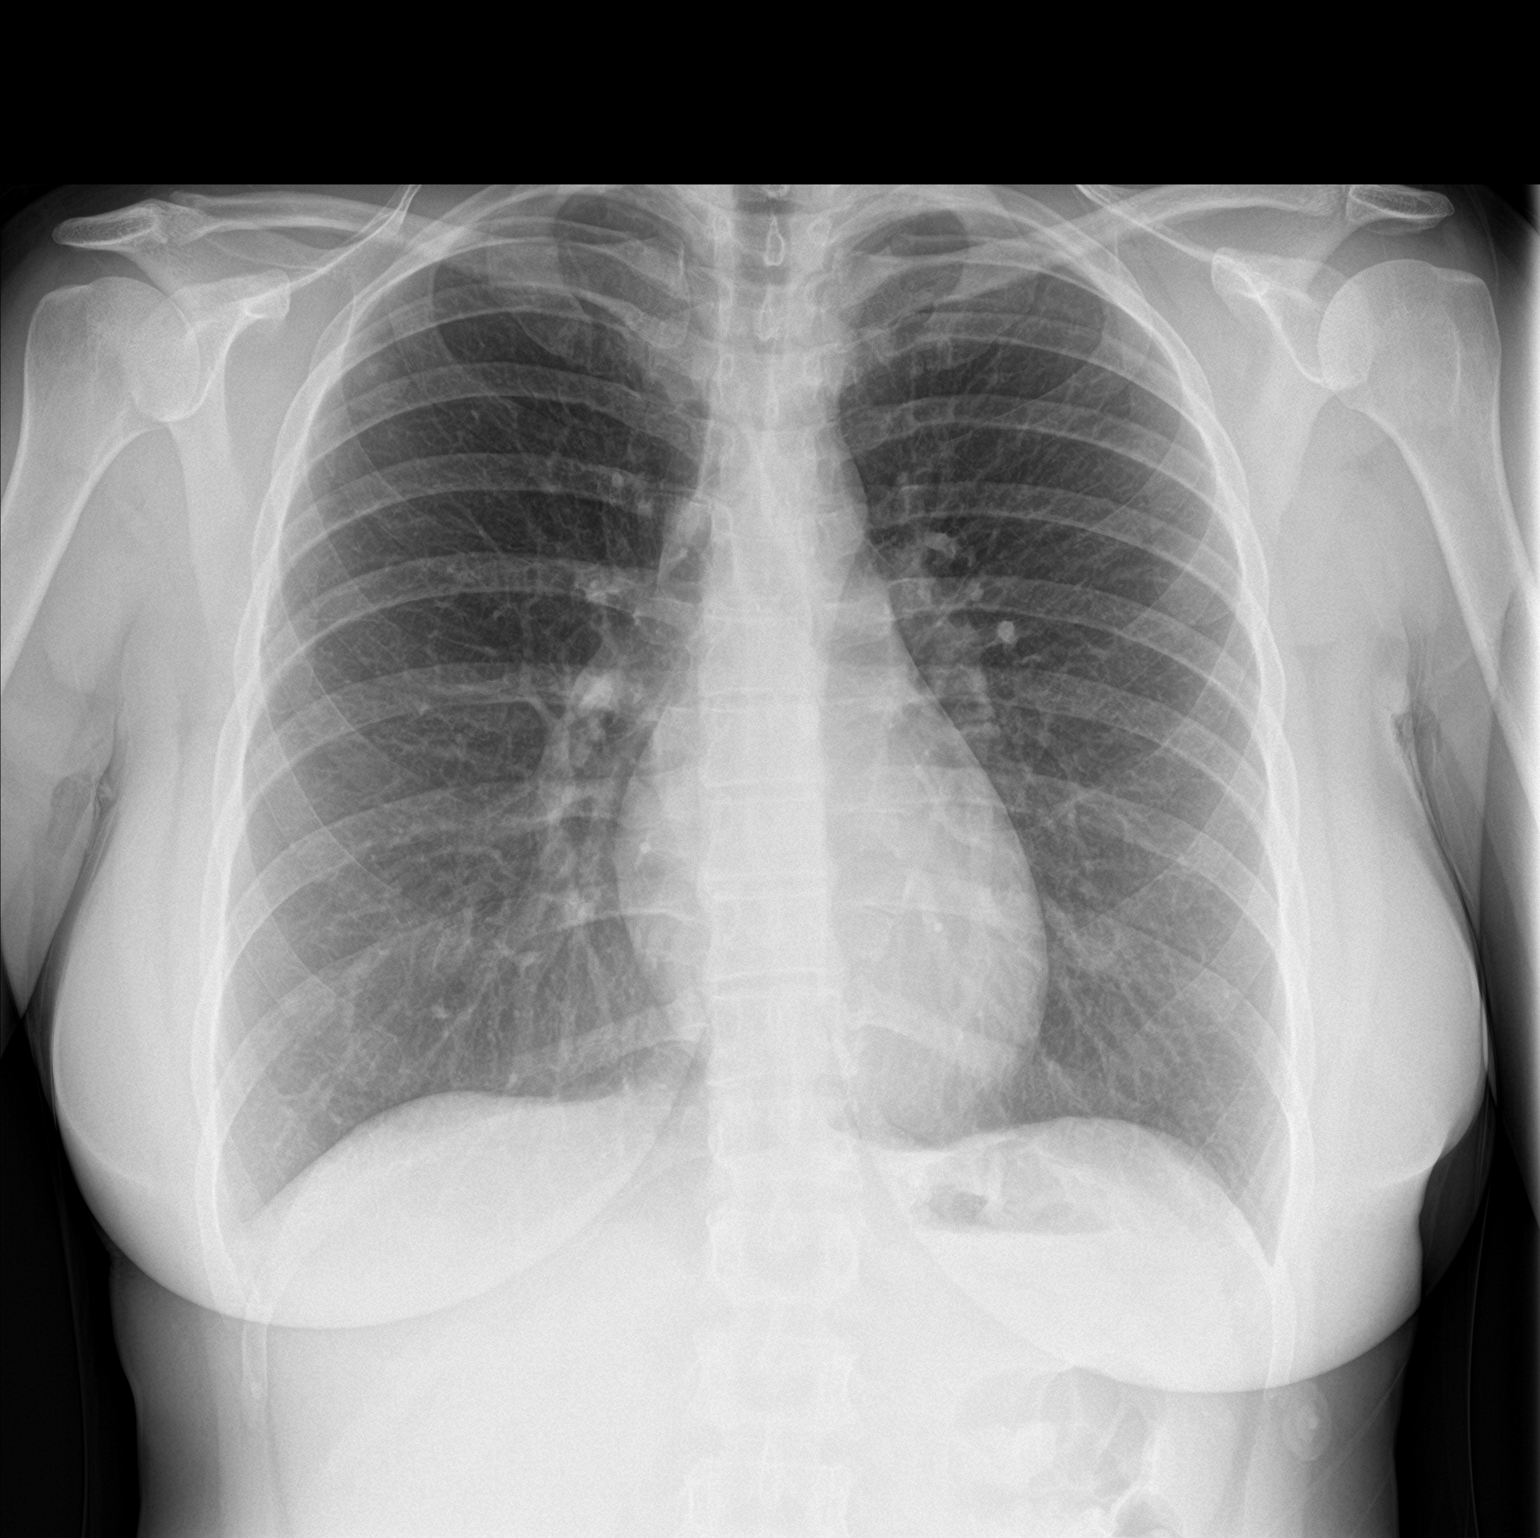

[chest lat]
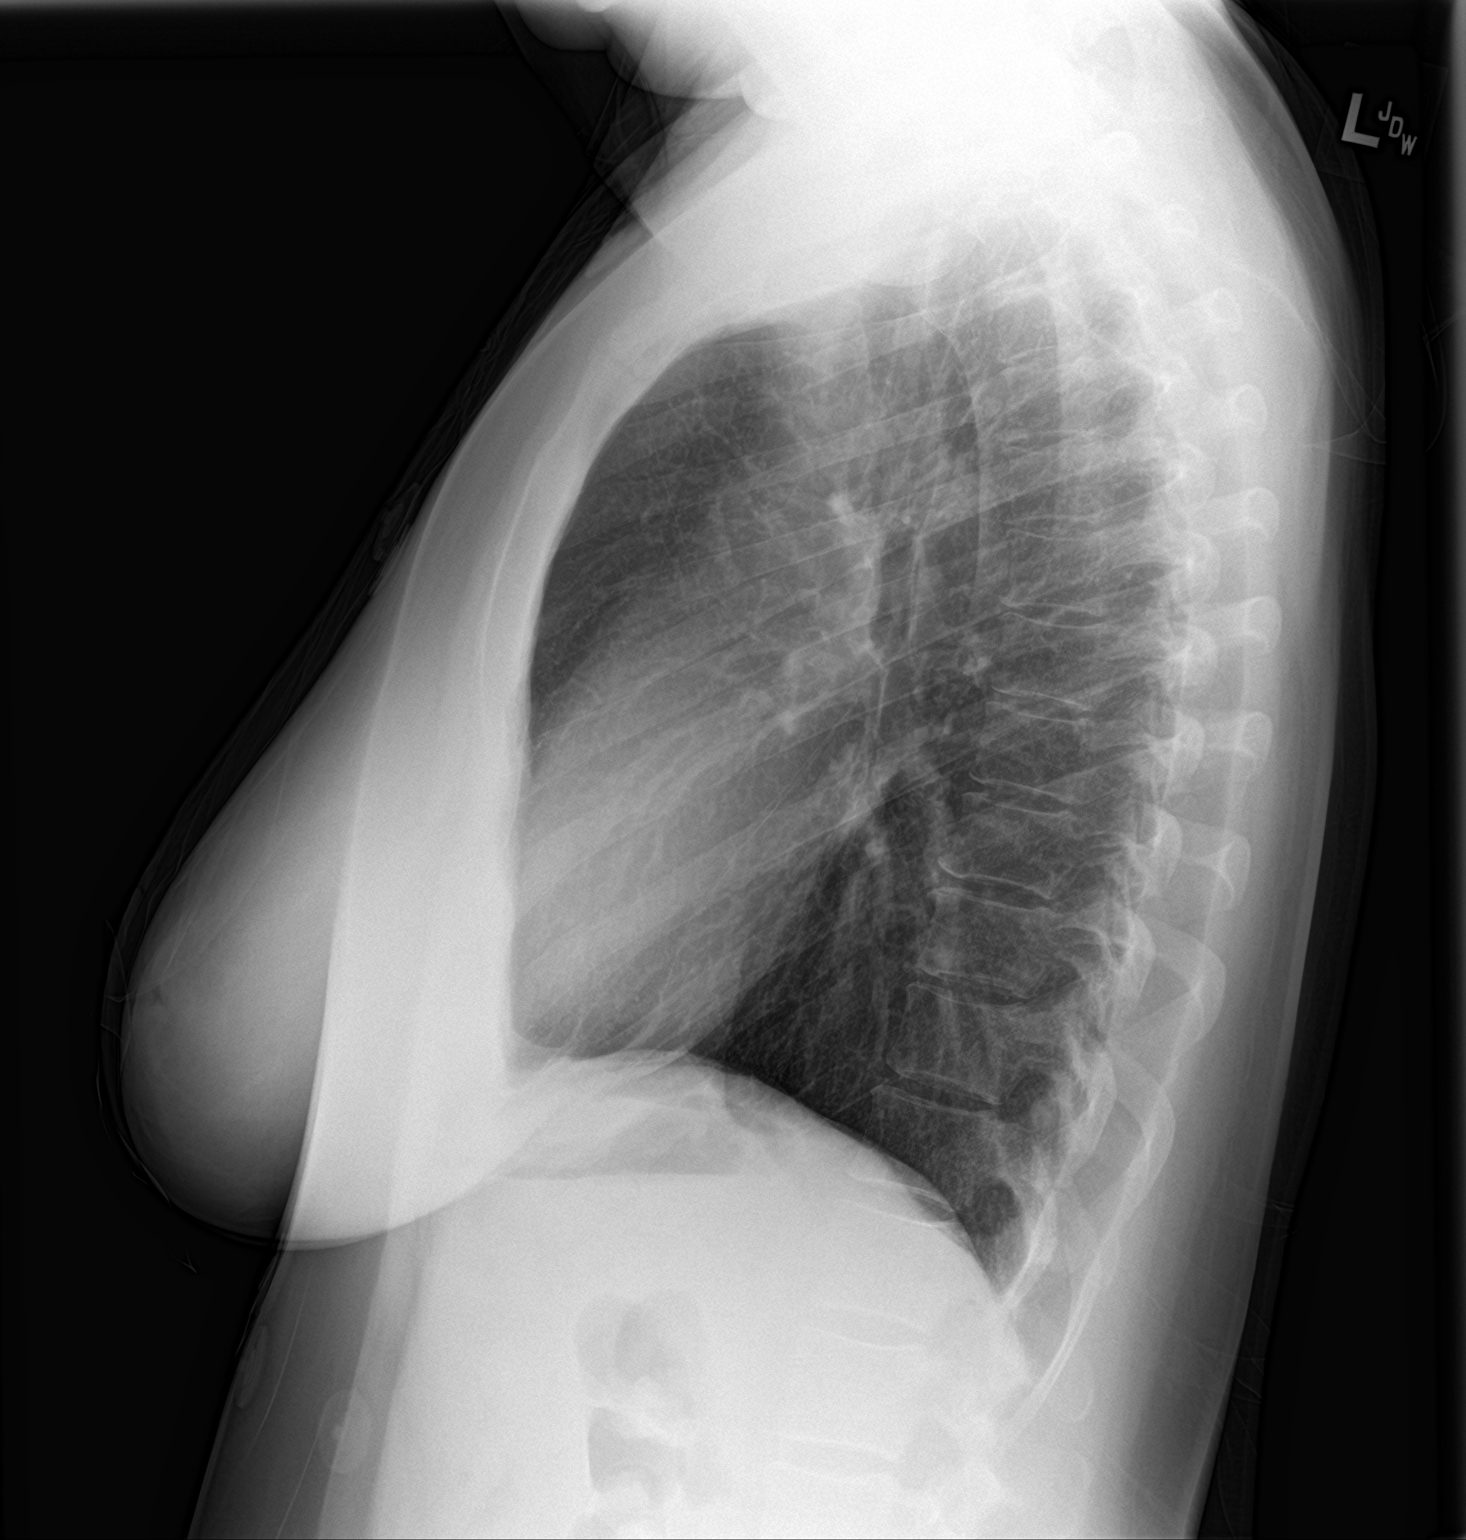

[2 of 2 positions shown; findings below may reference images not displayed]

FINDINGS: Normal heart size. Lungs clear. No pneumothorax. No pleural
effusion.
IMPRESSION: No active cardiopulmonary disease.

## 2019-07-14 ENCOUNTER — Ambulatory Visit: Payer: PRIVATE HEALTH INSURANCE | Attending: Obstetrics and Gynecology | Admitting: Physical Therapy

## 2019-07-14 ENCOUNTER — Other Ambulatory Visit: Payer: Self-pay

## 2019-07-14 ENCOUNTER — Encounter: Payer: Self-pay | Admitting: Physical Therapy

## 2019-07-14 DIAGNOSIS — M6281 Muscle weakness (generalized): Secondary | ICD-10-CM | POA: Insufficient documentation

## 2019-07-14 DIAGNOSIS — M62838 Other muscle spasm: Secondary | ICD-10-CM | POA: Diagnosis present

## 2019-07-14 NOTE — Patient Instructions (Signed)
Access Code: [email or web address] URL: [email or web address] Date: 07/14/2019 Prepared by: Dwana Curd  Exercises Seated Diaphragmatic Breathing - 3 x daily - 7 x weekly - 1 sets - 5 reps - 5 hold Prone Diaphragmatic Breathing - 1 x daily - 7 x weekly - 10 reps - 3 sets Diaphragmatic Breathing in Child's Pose with Pelvic Floor Relaxation - 1 x daily - 7 x weekly - 5 reps - 1 sets - 30 sec hold Child's Pose with Thread the Needle - 1 x daily - 7 x weekly - 10 reps - 3 sets Prone Alternating Bent Leg Fallout - 1 x daily - 7 x weekly - 10 reps - 3 sets

## 2019-07-14 NOTE — Therapy (Addendum)
[facility] [address] [place], [place], [address] Phone: [phone]   Fax:  [phone]  Physical Therapy Evaluation  Patient Details  Name: Alyssa Vazquez MRN: [id number] Date of Birth: December 28, 1980 Referring Provider (PT): Delsa Bern, MD   Encounter Date: 07/14/2019  PT End of Session - 07/14/19 0913    Visit Number  1    Date for PT Re-Evaluation  10/06/19    PT Start Time  0806    PT Stop Time  0840    PT Time Calculation (min)  34 min    Activity Tolerance  Patient tolerated treatment well    Behavior During Therapy  [facility] for tasks assessed/performed       Past Medical History:  Diagnosis Date  . History of abnormal Pap smear 02/17/2012   Abnormal in 1997; repeats have been normal  . IBS (irritable bowel syndrome)     Past Surgical History:  Procedure Laterality Date  . WISDOM TOOTH EXTRACTION  2001    There were no vitals filed for this visit.   Subjective Assessment - 07/14/19 0809    Subjective  Having a lot of urgency more lately.  Stress incontinence has been getting worse since last summer.  I have a dull ache on Rt side of perineum and vagina.  I don't feel like I am fully emptying my bladder.  I have a little leakage, not wearing pad but I am working from home.  If I was working remotely I would have to wear something.  Urinates every 2 hours. I have some leakage with yelling at the kids sometimes, but usually urgency.    Currently in Pain?  No/denies         [facility] PT Assessment - 07/20/19 0001      Assessment   Medical Diagnosis  N39.3 (ICD-10-CM) - Stress incontinence (female) (female)    Referring Provider (PT)  Delsa Bern, MD    Prior Therapy  Yes      Precautions   Precautions  None      Balance Screen   Has the patient fallen in the past 6 months  No      [place] residence    Living Arrangements  Spouse/significant  other;Children      Prior Function   Level of Independence  Independent      Cognition   Overall Cognitive Status  Within Functional Limits for tasks assessed      Posture/Postural Control   Posture/Postural Control  Postural limitations    Postural Limitations  Forward head;Decreased thoracic kyphosis      ROM / Strength   AROM / PROM / Strength  Strength;PROM      PROM   Overall PROM Comments  bilat hip IR 50%      Strength   Overall Strength Comments  LE grossly 5/5      Flexibility   Soft Tissue Assessment /Muscle Length  yes    Hamstrings  WFL      Palpation   SI assessment   no obliquities noted    Palpation comment  tension around gluteal attachments      Ambulation/Gait   Gait Pattern  Within Functional Limits                Objective measurements completed on examination: See above findings.    Pelvic Floor Special Questions - 07/20/19 0001    Prior Pelvic/Prostate Exam  Yes  Are you Pregnant or attempting pregnancy?  No    Prior Pregnancies  Yes    Number of Pregnancies  2    Number of Vaginal Deliveries  2    Currently Sexually Active  Yes    Is this Painful  No    Marinoff Scale  --   lower sex drive   Urinary Leakage  Yes   occasion small amounts   How often  throughout the day    Pad use  none at home    Urinary urgency  Yes    Urinary frequency  2 hours    Fecal incontinence  No    Falling out feeling (prolapse)  No    Skin Integrity  Intact    Perineal Body/Introitus   Normal    Pelvic Floor Internal Exam  pt identity confirmed and informed consent given to perform internal soft tissue assess/treat    Exam Type  Vaginal    Palpation  tight and tender bulbocavernosis and transverse peroneus Rt>Lt    Strength  fair squeeze, definite lift    Strength # of seconds  5       [facility] Adult PT Treatment/Exercise - 07/20/19 0001      Self-Care   Self-Care  Other Self-Care Comments    Other Self-Care Comments   intial HEP              PT Education - 07/14/19 0910    Education Details  Access Code: [email or web address]    Person(s) Educated  Patient    Methods  Explanation;Demonstration;Handout    Comprehension  Verbalized understanding       PT Short Term Goals - 07/20/19 0902      PT SHORT TERM GOAL #1   Title  independent with initial HEP    Time  4    Period  Weeks    Status  New    Target Date  08/11/19      PT SHORT TERM GOAL #2   Title  urinary urgency decreased >/= 25%    Time  4    Period  Weeks    Status  New    Target Date  08/11/19        PT Long Term Goals - 07/20/19 0858      PT LONG TERM GOAL #1   Title  independent with HEP    Time  12    Period  Weeks    Status  New    Target Date  10/06/19      PT LONG TERM GOAL #2   Title  Urinary urgency decreased by >/= 80%    Time  12    Period  Weeks    Status  New    Target Date  10/06/19      PT LONG TERM GOAL #3   Title  able to demonstrate >/= to 3/5 pelvic floor contraction with 10 second hold x 5 reps in order to demonstrate endurance needed for bladder control with functional activities that cause increased intra-abdominal pressure    Baseline  3/5 for 5 sec    Time  12    Period  Weeks    Status  New    Target Date  10/06/19      PT LONG TERM GOAL #4   Title  pain and tenderness in pelvic region improved by at least 60%    Time  12    Period  Weeks    Status  New    Target Date  10/06/19      PT LONG TERM GOAL #5   Title  Pt will feel confident to perform all community activities without using a pad    Time  12    Period  Weeks    Status  New    Target Date  10/06/19             Plan - 07/20/19 0908    Clinical Impression Statement  Pt presents to skilled PT due to urinary urgency and pelvic pain that has recently been worsening. Pt was assessed today with findings of pelvic strength 3/5 MMT and 5 sec hold. She has tight bulbocavernosis and transverse peroneus Rt>Lt. Strength is 5/5 bilateral hip, but hip  IR 50% limited. She has mild postural abnormalities of thoracic spine having decreased kyphosis slight forward head . Pt will benefit from skilled PT to address these limitation so she can return to full functional community acitvities and improved quality of life.    Personal Factors and Comorbidities  Time since onset of injury/illness/exacerbation    Examination-Activity Limitations  Continence    Examination-Participation Restrictions  Interpersonal Relationship    Stability/Clinical Decision Making  Stable/Uncomplicated    Rehab Potential  Excellent    PT Frequency  1x / week    PT Duration  12 weeks    PT Treatment/Interventions  ADLs/Self Care Home Management;Biofeedback;Cryotherapy;Electrical Stimulation;Moist Heat;Therapeutic activities;Therapeutic exercise;Neuromuscular re-education;Manual techniques;Passive range of motion;Dry needling;Taping    PT Next Visit Plan  work on bulging for toileting emptying bladder; breathing, internal STM; f/u on current stretches lumbar and hip    PT Home Exercise Plan  Access Code: [email or web address]    Consulted and Agree with Plan of Care  Patient       Patient will benefit from skilled therapeutic intervention in order to improve the following deficits and impairments:  Pain, Postural dysfunction, Increased fascial restricitons, Decreased strength, Impaired tone, Decreased range of motion, Decreased endurance  Visit Diagnosis: Muscle weakness (generalized)  Other muscle spasm     Problem List Patient Active Problem List   Diagnosis Date Noted  . History of abnormal Pap smear 02/17/2012  . History of IBS 02/17/2012  . Normal pregnancy 09/13/2010    [name], PT 07/20/2019, 9:16 AM  [facility] [address] [place], [place], [address] Phone: [phone]   Fax:  [phone]  Name: Alyssa Vazquez MRN: [id number] Date of Birth: 04/12/1980

## 2019-07-20 NOTE — Addendum Note (Signed)
Addended by: Beatris Si on: 07/20/2019 09:17 AM   Modules accepted: Orders

## 2019-08-15 ENCOUNTER — Other Ambulatory Visit: Payer: Self-pay

## 2019-08-15 ENCOUNTER — Ambulatory Visit: Payer: PRIVATE HEALTH INSURANCE | Attending: Obstetrics and Gynecology | Admitting: Physical Therapy

## 2019-08-15 ENCOUNTER — Encounter: Payer: Self-pay | Admitting: Physical Therapy

## 2019-08-15 DIAGNOSIS — M6281 Muscle weakness (generalized): Secondary | ICD-10-CM | POA: Insufficient documentation

## 2019-08-15 DIAGNOSIS — R279 Unspecified lack of coordination: Secondary | ICD-10-CM | POA: Diagnosis present

## 2019-08-15 DIAGNOSIS — M62838 Other muscle spasm: Secondary | ICD-10-CM | POA: Diagnosis present

## 2019-08-15 NOTE — Patient Instructions (Addendum)
Double Voiding can be a very useful technique to help overcome incomplete emptying of your bladder.  Incomplete emptying of urine can result in leakage after using the bathroom and increase the risk of urinary tract infection.   Initial Void: When you first sit down to urinate, ensure optimal positioning for bladder emptying by following these guidelines for toileting posture: Sit on the toilet seat - don't hover over the seat Support your trunk by placing your hands on your knees or thighs Spread your knees and hips wide Position your feet flat on the floor or elevate feet on phone books, foot stool (Squatty Potty), or wrapped toilet paper rolls (if having knees above hips helps you empty) Lean forward from your hips Maintain the normal inward curve in your lower back   Repeated Void: After your initial void is complete, follow these movement patterns and attempt going to the bathroom again. Stand up Rotate your hips as if doing hula hoop in one direction Rotate using the same action in the other direction Rock your hips and pelvis back and forwards ("pelvic tilts") Rock your hips and pelvis side to side ("tail wag") Sit back down and repeat your voiding technique This technique can be repeated as many times as you choose to help you empty your bladder more effectively.  Breathing during bowel movement:  1. Back straight - sit up low back to upper back not rounding forward - look straight ahead 2. Lean forward - only as far as possible with back staying straight 3. Breathe - slow as you can inhaling down into your belly and feeling pressure on the pelvic floor 4. Hard belly - keep belly like a hard ball on the max inhale 5. Blow hard - like blowing up a balloon and pressure down into pelvic floor 6. Squeeze and lift - tighten pelvic floor after BM to reset everything back into place  [facility] [address], [address] [place], [place] [address] Phone #  [phone] Fax [phone]  Access Code: [email or web address] URL: [email or web address] Date: 08/15/2019 Prepared by: Dwana Curd  Exercises Seated Diaphragmatic Breathing - 3 x daily - 7 x weekly - 1 sets - 5 reps - 5 hold Prone Diaphragmatic Breathing - 1 x daily - 7 x weekly - 10 reps - 3 sets Prone Alternating Bent Leg Fallout - 1 x daily - 7 x weekly - 10 reps - 3 sets Diaphragmatic Breathing in Supported Child's Pose with Pelvic Floor Relaxation - 1 x daily - 7 x weekly - 10 reps - 3 sets Prone Thoracic Rotation with Reach - 1 x daily - 7 x weekly - 10 reps - 3 sets Static Prone on Elbows - 1 x daily - 7 x weekly - 10 reps - 3 sets

## 2019-08-15 NOTE — Therapy (Signed)
[facility] [address] [place], [place], [address] Phone: [phone]   Fax:  [phone]  Physical Therapy Treatment  Patient Details  Name: Alyssa Vazquez MRN: [id number] Date of Birth: [birth year] Referring Provider (PT): Delsa Bern, MD   Encounter Date: 08/15/2019  PT End of Session - 08/15/19 1014    Visit Number  2    Date for PT Re-Evaluation  10/06/19    PT Start Time  0931    PT Stop Time  1010    PT Time Calculation (min)  39 min    Activity Tolerance  Patient tolerated treatment well    Behavior During Therapy  [facility] for tasks assessed/performed       Past Medical History:  Diagnosis Date  . History of abnormal Pap smear 02/17/2012   Abnormal in 1997; repeats have been normal  . IBS (irritable bowel syndrome)     Past Surgical History:  Procedure Laterality Date  . WISDOM TOOTH EXTRACTION  2001    There were no vitals filed for this visit.  Subjective Assessment - 08/15/19 1039    Subjective  I still have a lot of urgency.  have to hang up mid phone call to get to the bathroom.  Pt is doing the stretches, but doesn't like the child poses due to uncomfortable on stomach.    Currently in Pain?  No/denies                     Pelvic Floor Special Questions - 08/15/19 0001    Pelvic Floor Internal Exam  pt identity confirmed and informed consent given to perform internal soft tissue assess/treat    Palpation  STM to left levators and tranverse peronus tactile cues to relax    Strength  fair squeeze, definite lift    Strength # of reps  7   quick flicks in 10 seconds   Strength # of seconds  13.5        [facility] Adult PT Treatment/Exercise - 08/15/19 0001      Self-Care   Self-Care  Other Self-Care Comments    Other Self-Care Comments   toileting breathing and urge drills; doule voiding      Neuro Re-ed    Neuro Re-ed Details   tactile cues to relax and bulge      Exercises   Exercises  Lumbar      Lumbar Exercises: Stretches   Prone Mid Back Stretch Limitations  press up and press up with rotation - 3 x 10sec    Other Lumbar Stretch Exercise  child pose and flexion and extension on the ball              PT Education - 08/15/19 1012    Education Details  Access Code: [email or web address]    Person(s) Educated  Patient    Methods  Explanation;Demonstration;Verbal cues;Handout    Comprehension  Verbalized understanding;Returned demonstration       PT Short Term Goals - 08/15/19 1035      PT SHORT TERM GOAL #1   Title  independent with initial HEP    Status  Achieved        PT Long Term Goals - 07/20/19 0858      PT LONG TERM GOAL #1   Title  independent with HEP    Time  12    Period  Weeks    Status  New    Target Date  10/06/19  PT LONG TERM GOAL #2   Title  Urinary urgency decreased by >/= 80%    Time  12    Period  Weeks    Status  New    Target Date  10/06/19      PT LONG TERM GOAL #3   Title  able to demonstrate >/= to 3/5 pelvic floor contraction with 10 second hold x 5 reps in order to demonstrate endurance needed for bladder control with functional activities that cause increased intra-abdominal pressure    Baseline  3/5 for 5 sec    Time  12    Period  Weeks    Status  New    Target Date  10/06/19      PT LONG TERM GOAL #4   Title  pain and tenderness in pelvic region improved by at least 60%    Time  12    Period  Weeks    Status  New    Target Date  10/06/19      PT LONG TERM GOAL #5   Title  Pt will feel confident to perform all community activities without using a pad    Time  12    Period  Weeks    Status  New    Target Date  10/06/19            Plan - 08/15/19 1026    Clinical Impression Statement  Pt did well with bulging but has hard time keeping muscles relaxed for more than 2 seconds at a time.  Pt had better response from lumbar and thoracic extension stretches.  Pt was tight Lt posterior  levators and got some release with STM.  Able to do 7 quick flicks in 10 seconds today and hold for 13.5 seconds with 3/5 MMT.  Pt will benefit from skilled PT to work on coordination of core and pelvic floor..  Needed some cues to relax and she tends to keep elevated tone in tranvers peroneus and levators.    PT Treatment/Interventions  ADLs/Self Care Home Management;Biofeedback;Cryotherapy;Electrical Stimulation;Moist Heat;Therapeutic activities;Therapeutic exercise;Neuromuscular re-education;Manual techniques;Passive range of motion;Dry needling;Taping    PT Next Visit Plan  f/u on toileting and voiding as needed    PT Home Exercise Plan  Access Code: [email or web address]    Consulted and Agree with Plan of Care  Patient       Patient will benefit from skilled therapeutic intervention in order to improve the following deficits and impairments:  Pain, Postural dysfunction, Increased fascial restricitons, Decreased strength, Impaired tone, Decreased range of motion, Decreased endurance  Visit Diagnosis: Muscle weakness (generalized)  Other muscle spasm  Unspecified lack of coordination     Problem List Patient Active Problem List   Diagnosis Date Noted  . History of abnormal Pap smear 02/17/2012  . History of IBS 02/17/2012  . Normal pregnancy 09/13/2010    Alyssa Vazquez, PT 08/15/2019, 10:40 AM  [facility] [address] [place], [place], [address] Phone: [phone]   Fax:  [phone]  Name: Alyssa Vazquez MRN: [id number] Date of Birth: 11/24/1980

## 2019-08-24 ENCOUNTER — Ambulatory Visit: Payer: PRIVATE HEALTH INSURANCE | Admitting: Physical Therapy

## 2019-08-24 ENCOUNTER — Encounter: Payer: Self-pay | Admitting: Physical Therapy

## 2019-08-24 ENCOUNTER — Other Ambulatory Visit: Payer: Self-pay

## 2019-08-24 DIAGNOSIS — M6281 Muscle weakness (generalized): Secondary | ICD-10-CM | POA: Diagnosis not present

## 2019-08-24 DIAGNOSIS — R279 Unspecified lack of coordination: Secondary | ICD-10-CM

## 2019-08-24 DIAGNOSIS — M62838 Other muscle spasm: Secondary | ICD-10-CM

## 2019-08-24 NOTE — Therapy (Signed)
[facility] [address] [place], [place], [address] Phone: [phone]   Fax:  [phone]  Physical Therapy Treatment  Patient Details  Name: Alyssa Vazquez MRN: [id number] Date of Birth: 1980/07/03 Referring Provider (PT): Silverio Lay, MD   Encounter Date: 08/24/2019   PT End of Session - 08/24/19 0932    Visit Number 3    Date for PT Re-Evaluation 10/06/19    PT Start Time 0849    PT Stop Time 0928    PT Time Calculation (min) 39 min    Activity Tolerance Patient tolerated treatment well    Behavior During Therapy [facility] for tasks assessed/performed           Past Medical History:  Diagnosis Date  . History of abnormal Pap smear 02/17/2012   Abnormal in 1997; repeats have been normal  . IBS (irritable bowel syndrome)     Past Surgical History:  Procedure Laterality Date  . WISDOM TOOTH EXTRACTION  2001    There were no vitals filed for this visit.   Subjective Assessment - 08/24/19 0906    Subjective I only had one really bad time.  I overall have felt a little better    Currently in Pain? No/denies                             [facility] Adult PT Treatment/Exercise - 08/24/19 0001      Neuro Re-ed    Neuro Re-ed Details  cues for lumbar alignment and TrA with all exercises; diaphragmatic breathing      Lumbar Exercises: Standing   Other Standing Lumbar Exercises hip hinge with dowel and then with 3lb weight      Lumbar Exercises: Supine   Other Supine Lumbar Exercises dead bug and head lift with arm and leg over      Lumbar Exercises: Quadruped   Opposite Arm/Leg Raise 10 reps    Other Quadruped Lumbar Exercises fire hydrant      Manual Therapy   Manual therapy comments abdominal fascial release                    PT Short Term Goals - 08/15/19 1035      PT SHORT TERM GOAL #1   Title independent with initial HEP    Status Achieved             PT  Long Term Goals - 07/20/19 0858      PT LONG TERM GOAL #1   Title independent with HEP    Time 12    Period Weeks    Status New    Target Date 10/06/19      PT LONG TERM GOAL #2   Title Urinary urgency decreased by >/= 80%    Time 12    Period Weeks    Status New    Target Date 10/06/19      PT LONG TERM GOAL #3   Title able to demonstrate >/= to 3/5 pelvic floor contraction with 10 second hold x 5 reps in order to demonstrate endurance needed for bladder control with functional activities that cause increased intra-abdominal pressure    Baseline 3/5 for 5 sec    Time 12    Period Weeks    Status New    Target Date 10/06/19      PT LONG TERM GOAL #4   Title pain and tenderness in  pelvic region improved by at least 60%    Time 12    Period Weeks    Status New    Target Date 10/06/19      PT LONG TERM GOAL #5   Title Pt will feel confident to perform all community activities without using a pad    Time 12    Period Weeks    Status New    Target Date 10/06/19                 Plan - 08/24/19 4496    Clinical Impression Statement Pt did well with initial core strengthening program. She needed cues to keep neutral spine and did well with dowel for hip hinges. Pt is making progress and reports no pain or leakage this week. One instance of urgency.  She has still not returned to normal work outside of the home.    PT Treatment/Interventions ADLs/Self Care Home Management;Biofeedback;Cryotherapy;Electrical Stimulation;Moist Heat;Therapeutic activities;Therapeutic exercise;Neuromuscular re-education;Manual techniques;Passive range of motion;Dry needling;Taping    PT Next Visit Plan porgress and f/u with core strength, standing hip side step with band possibly; modified planks    PT Home Exercise Plan Access Code: [email or web address]    Consulted and Agree with Plan of Care Patient           Patient will benefit from skilled therapeutic intervention in order to improve the  following deficits and impairments:  Pain, Postural dysfunction, Increased fascial restricitons, Decreased strength, Impaired tone, Decreased range of motion, Decreased endurance  Visit Diagnosis: Muscle weakness (generalized)  Other muscle spasm  Unspecified lack of coordination     Problem List Patient Active Problem List   Diagnosis Date Noted  . History of abnormal Pap smear 02/17/2012  . History of IBS 02/17/2012  . Normal pregnancy 09/13/2010    Jule Ser, PT 08/24/2019, 10:17 AM  [facility] [address], [place], [place], [address] Phone: [phone]   Fax:  [phone]  Name: Alyssa Vazquez MRN: [id number] Date of Birth: 05/30/1980

## 2019-08-31 ENCOUNTER — Ambulatory Visit: Payer: PRIVATE HEALTH INSURANCE | Admitting: Physical Therapy

## 2019-08-31 ENCOUNTER — Other Ambulatory Visit: Payer: Self-pay

## 2019-08-31 ENCOUNTER — Encounter: Payer: Self-pay | Admitting: Physical Therapy

## 2019-08-31 DIAGNOSIS — M62838 Other muscle spasm: Secondary | ICD-10-CM

## 2019-08-31 DIAGNOSIS — M6281 Muscle weakness (generalized): Secondary | ICD-10-CM | POA: Diagnosis not present

## 2019-08-31 DIAGNOSIS — R279 Unspecified lack of coordination: Secondary | ICD-10-CM

## 2019-08-31 NOTE — Therapy (Signed)
[facility] [address] [place], [place], [address] Phone: [phone]   Fax:  [phone]  Physical Therapy Treatment  Patient Details  Name: Alyssa Vazquez MRN: [id number] Date of Birth: Mar 18, 1980 Referring Provider (PT): Silverio Lay, MD   Encounter Date: 08/31/2019   PT End of Session - 08/31/19 0850    Visit Number 4    Date for PT Re-Evaluation 10/06/19    PT Start Time 0849    PT Stop Time 0928    PT Time Calculation (min) 39 min    Activity Tolerance Patient tolerated treatment well    Behavior During Therapy [facility] for tasks assessed/performed           Past Medical History:  Diagnosis Date  . History of abnormal Pap smear 02/17/2012   Abnormal in 1997; repeats have been normal  . IBS (irritable bowel syndrome)     Past Surgical History:  Procedure Laterality Date  . WISDOM TOOTH EXTRACTION  2001    There were no vitals filed for this visit.   Subjective Assessment - 08/31/19 1058    Subjective I felt some bladder spasms and then it goes away.  Overall, can still feel like I have to pee much more frequently.    Currently in Pain? No/denies                             [facility] Adult PT Treatment/Exercise - 08/31/19 0001      Neuro Re-ed    Neuro Re-ed Details  breathing with core and oblique activations using sheet around core, TrA cues throughout      Exercises   Exercises Other Exercises    Other Exercises  ribcage breathing      Lumbar Exercises: Supine   Other Supine Lumbar Exercises marching and knee to the side      Lumbar Exercises: Prone   Other Prone Lumbar Exercises inclined push ups      Lumbar Exercises: Quadruped   Other Quadruped Lumbar Exercises rocking; knee plank into press up                    PT Short Term Goals - 08/15/19 1035      PT SHORT TERM GOAL #1   Title independent with initial HEP    Status Achieved              PT Long Term Goals - 07/20/19 0858      PT LONG TERM GOAL #1   Title independent with HEP    Time 12    Period Weeks    Status New    Target Date 10/06/19      PT LONG TERM GOAL #2   Title Urinary urgency decreased by >/= 80%    Time 12    Period Weeks    Status New    Target Date 10/06/19      PT LONG TERM GOAL #3   Title able to demonstrate >/= to 3/5 pelvic floor contraction with 10 second hold x 5 reps in order to demonstrate endurance needed for bladder control with functional activities that cause increased intra-abdominal pressure    Baseline 3/5 for 5 sec    Time 12    Period Weeks    Status New    Target Date 10/06/19      PT LONG TERM GOAL #4   Title pain and tenderness in  pelvic region improved by at least 60%    Time 12    Period Weeks    Status New    Target Date 10/06/19      PT LONG TERM GOAL #5   Title Pt will feel confident to perform all community activities without using a pad    Time 12    Period Weeks    Status New    Target Date 10/06/19                 Plan - 08/31/19 1045    Clinical Impression Statement Pt has been doing the exercises.  She has been feeling like she cannot isolate the abdominal muscles with the some of the exercises and she feels it in her back. She was able to demosntrate more abdominal activation with some cues for enabling her to achieve a greater posterior pelvic tilt.  Pt also responded well to cues for improved activation of obliques.  She has some stiffness of the thoracic spine and felt good with stretch on foam roller.  Pt will benefit from skilled Pt to continue working on core strength.    PT Treatment/Interventions ADLs/Self Care Home Management;Biofeedback;Cryotherapy;Electrical Stimulation;Moist Heat;Therapeutic activities;Therapeutic exercise;Neuromuscular re-education;Manual techniques;Passive range of motion;Dry needling;Taping    PT Next Visit Plan porgress and f/u with core strength and ribcage mobility and  ribcage breathing, standing hip side step with band possibly; modified planks    PT Home Exercise Plan Access Code: [email or web address]    Consulted and Agree with Plan of Care Patient           Patient will benefit from skilled therapeutic intervention in order to improve the following deficits and impairments:  Pain, Postural dysfunction, Increased fascial restricitons, Decreased strength, Impaired tone, Decreased range of motion, Decreased endurance  Visit Diagnosis: Muscle weakness (generalized)  Other muscle spasm  Unspecified lack of coordination     Problem List Patient Active Problem List   Diagnosis Date Noted  . History of abnormal Pap smear 02/17/2012  . History of IBS 02/17/2012  . Normal pregnancy 09/13/2010    Jule Ser, PT 08/31/2019, 11:23 AM  [facility] [address] [place], [place], [address] Phone: [phone]   Fax:  [phone]  Name: KIMBERLYE DILGER MRN: [id number] Date of Birth: 1980-05-26

## 2019-09-07 ENCOUNTER — Other Ambulatory Visit: Payer: Self-pay

## 2019-09-07 ENCOUNTER — Ambulatory Visit: Payer: PRIVATE HEALTH INSURANCE | Attending: Obstetrics and Gynecology | Admitting: Physical Therapy

## 2019-09-07 DIAGNOSIS — M6281 Muscle weakness (generalized): Secondary | ICD-10-CM | POA: Diagnosis not present

## 2019-09-07 DIAGNOSIS — R279 Unspecified lack of coordination: Secondary | ICD-10-CM | POA: Insufficient documentation

## 2019-09-07 DIAGNOSIS — M62838 Other muscle spasm: Secondary | ICD-10-CM

## 2019-09-07 NOTE — Therapy (Addendum)
[facility] [address], [place], [place], [address] Phone: [phone]   Fax:  [phone]  Physical Therapy Treatment  Patient Details  Name: LANDA MULLINAX MRN: [id number] Date of Birth: 22-Jan-1981 Referring Provider (PT): Delsa Bern, MD   Encounter Date: 09/07/2019   PT End of Session - 09/07/19 0929    Visit Number 5    Date for PT Re-Evaluation 10/06/19    PT Start Time 2595   arrived late   PT Stop Time 0928    PT Time Calculation (min) 30 min    Activity Tolerance Patient tolerated treatment well    Behavior During Therapy [facility] for tasks assessed/performed           Past Medical History:  Diagnosis Date  . History of abnormal Pap smear 02/17/2012   Abnormal in 1997; repeats have been normal  . IBS (irritable bowel syndrome)     Past Surgical History:  Procedure Laterality Date  . WISDOM TOOTH EXTRACTION  2001    There were no vitals filed for this visit.   Subjective Assessment - 09/07/19 0901    Subjective I could feel the core work from last week.    Currently in Pain? No/denies                             [facility] Adult PT Treatment/Exercise - 09/07/19 0001      Neuro Re-ed    Neuro Re-ed Details  cues for correct alignment and maintaining neutral pelvis      Lumbar Exercises: Standing   Other Standing Lumbar Exercises pallof press in staggared stance; hold with squat - 10x each side    Other Standing Lumbar Exercises rotation with red band      Lumbar Exercises: Sidelying   Other Sidelying Lumbar Exercises side plank on knees 5x 10 sec hold                  PT Education - 09/07/19 0929    Education Details Access Code: [email or web address]    Person(s) Educated Patient    Methods Explanation;Demonstration;Verbal cues;Handout    Comprehension Verbalized understanding;Returned demonstration            PT Short Term Goals - 08/15/19 1035      PT SHORT TERM  GOAL #1   Title independent with initial HEP    Status Achieved             PT Long Term Goals - 07/20/19 0858      PT LONG TERM GOAL #1   Title independent with HEP    Time 12    Period Weeks    Status New    Target Date 10/06/19      PT LONG TERM GOAL #2   Title Urinary urgency decreased by >/= 80%    Time 12    Period Weeks    Status New    Target Date 10/06/19      PT LONG TERM GOAL #3   Title able to demonstrate >/= to 3/5 pelvic floor contraction with 10 second hold x 5 reps in order to demonstrate endurance needed for bladder control with functional activities that cause increased intra-abdominal pressure    Baseline 3/5 for 5 sec    Time 12    Period Weeks    Status New    Target Date 10/06/19      PT LONG TERM GOAL #  4   Title pain and tenderness in pelvic region improved by at least 60%    Time 12    Period Weeks    Status New    Target Date 10/06/19      PT LONG TERM GOAL #5   Title Pt will feel confident to perform all community activities without using a pad    Time 12    Period Weeks    Status New    Target Date 10/06/19                 Plan - 09/07/19 0930    Clinical Impression Statement Pt did well with porgression of core strength with focus on obliques today.  Pt has not noticed a decrease in voiding at this time and will benefit from skilled PT ot continue to work on muscle coordination and strength for improved function.    PT Treatment/Interventions ADLs/Self Care Home Management;Biofeedback;Cryotherapy;Electrical Stimulation;Moist Heat;Therapeutic activities;Therapeutic exercise;Neuromuscular re-education;Manual techniques;Passive range of motion;Dry needling;Taping    PT Next Visit Plan progress core strength; assess Rt obdurator if needed; f/u on bladder diary; hip rotation stretch and strengthening    PT Home Exercise Plan Access Code: [email or web address]    Consulted and Agree with Plan of Care Patient           Patient will benefit  from skilled therapeutic intervention in order to improve the following deficits and impairments:  Pain, Postural dysfunction, Increased fascial restricitons, Decreased strength, Impaired tone, Decreased range of motion, Decreased endurance  Visit Diagnosis: Muscle weakness (generalized)  Other muscle spasm  Unspecified lack of coordination     Problem List Patient Active Problem List   Diagnosis Date Noted  . History of abnormal Pap smear 02/17/2012  . History of IBS 02/17/2012  . Normal pregnancy 09/13/2010    Jule Ser, PT 09/07/2019, 10:27 AM  [facility] [address], [place], [place], [address] Phone: [phone]   Fax:  [phone]  Name: LIESEL PECKENPAUGH MRN: [id number] Date of Birth: 04-24-1980  PHYSICAL THERAPY DISCHARGE SUMMARY  Visits from Start of Care: 5  Current functional level related to goals / functional outcomes: See above goals   Remaining deficits: See above   Education / Equipment: HEP  Plan: Patient agrees to discharge.  Patient goals were partially met. Patient is being discharged due to meeting the stated rehab goals.  ?????     Pt canceled due to feeling better  Gustavus Bryant, PT 10/16/19 8:28 AM

## 2019-09-07 NOTE — Patient Instructions (Signed)
Access Code: [email or web address] URL: [email or web address] Date: 09/07/2019 Prepared by: Dwana Curd  Exercises Seated Diaphragmatic Breathing - 3 x daily - 7 x weekly - 1 sets - 5 reps - 5 hold Prone Diaphragmatic Breathing - 1 x daily - 7 x weekly - 10 reps - 3 sets Prone Alternating Bent Leg Fallout - 1 x daily - 7 x weekly - 10 reps - 3 sets Diaphragmatic Breathing in Supported Child's Pose with Pelvic Floor Relaxation - 1 x daily - 7 x weekly - 10 reps - 3 sets Prone Thoracic Rotation with Reach - 1 x daily - 7 x weekly - 10 reps - 3 sets Static Prone on Elbows - 1 x daily - 7 x weekly - 10 reps - 3 sets Standing Hip Hinge with Dowel - 1 x daily - 7 x weekly - 3 sets - 10 reps Bird Dog - 1 x daily - 7 x weekly - 3 sets - 10 reps Dead Bug - 1 x daily - 7 x weekly - 3 sets - 10 reps Full Plank on Knees - 1 x daily - 7 x weekly - 3 sets - 10 reps Supine Breathing with Hands on Ribcage - 1 x daily - 7 x weekly - 3 sets - 10 reps Standing Anti-Rotation Press with Anchored Resistance - 1 x daily - 7 x weekly - 3 sets - 10 reps Standing Trunk Rotation with Resistance - 1 x daily - 7 x weekly - 3 sets - 10 reps

## 2019-09-14 ENCOUNTER — Encounter: Payer: PRIVATE HEALTH INSURANCE | Admitting: Physical Therapy

## 2020-02-16 ENCOUNTER — Ambulatory Visit: Payer: Self-pay | Attending: Internal Medicine

## 2020-02-16 DIAGNOSIS — Z23 Encounter for immunization: Secondary | ICD-10-CM

## 2020-02-16 NOTE — Progress Notes (Signed)
   Covid-19 Vaccination Clinic  Name:  Alyssa Vazquez    MRN: [id number] DOB: [birth year]  02/16/2020  [name] was observed post Covid-19 immunization for 15 minutes without incident. She was provided with Vaccine Information Sheet and instruction to access the V-Safe system.   [name] was instructed to call 911 with any severe reactions post vaccine: Marland Kitchen Difficulty breathing  . Swelling of face and throat  . A fast heartbeat  . A bad rash all over body  . Dizziness and weakness   Immunizations Administered    Name Date Dose VIS Date Route   Pfizer COVID-19 Vaccine 02/16/2020  3:54 PM 0.3 mL 12/27/2019 Intramuscular   Manufacturer: [employer], [employer]   Lot: [id number]   NDC: [address]-6

## 2021-07-02 ENCOUNTER — Ambulatory Visit: Payer: Self-pay | Admitting: Physical Therapy

## 2021-07-07 ENCOUNTER — Ambulatory Visit: Payer: PRIVATE HEALTH INSURANCE | Attending: Obstetrics and Gynecology

## 2021-07-07 DIAGNOSIS — M629 Disorder of muscle, unspecified: Secondary | ICD-10-CM | POA: Insufficient documentation

## 2021-07-07 DIAGNOSIS — M6281 Muscle weakness (generalized): Secondary | ICD-10-CM | POA: Diagnosis not present

## 2021-07-07 DIAGNOSIS — R279 Unspecified lack of coordination: Secondary | ICD-10-CM | POA: Insufficient documentation

## 2021-07-07 NOTE — Patient Instructions (Addendum)
Urge suppression technique: ?A technique to help you hold urine until it?s an appropriate time to go, ?whether this is making it home or trying to reach a specific voiding time ?frame according to your schedule. ?It helps to send signals from the bladder to the brain that say you don?t ?actually have to void urine right now. ?This most likely only give you several minutes of relief at first, but repeat as ?needed; the benefit will last longer as you use this technique more and get ?into better bladder habits. ??  ?The technique: ?o Perform 5 quick flicks (Kegels) rapidly, not worrying about fully ?relaxing in between each (only in this technique). ?o Then perform several deep belly breaths while focusing on relaxing ?the pelvic floor. ?o Go do something else to help distract yourself from the urge to ?urinate. ?o Repeat as needed. ? ? ?SPLINTING: ?When you're having a bowel movement, try pressing up and backwards gentle between the anus and the vagina with toilet paper to help stimulate a bowel movement without straining.  ?

## 2021-07-07 NOTE — Therapy (Signed)
OUTPATIENT PHYSICAL THERAPY FEMALE PELVIC EVALUATION   Patient Name: Alyssa Vazquez MRN: [id number] DOB:1980-10-22, 41 y.o., female Today's Date: 07/07/2021   PT End of Session - 07/07/21 1310     Visit Number 1    Date for PT Re-Evaluation 09/29/21    Authorization Type [employer]    PT Start Time 1231    PT Stop Time 1310    PT Time Calculation (min) 39 min    Activity Tolerance Patient tolerated treatment well    Behavior During Therapy WFL for tasks assessed/performed             Past Medical History:  Diagnosis Date   History of abnormal Pap smear 02/17/2012   Abnormal in 1997; repeats have been normal   IBS (irritable bowel syndrome)    Past Surgical History:  Procedure Laterality Date   WISDOM TOOTH EXTRACTION  2001   Patient Active Problem List   Diagnosis Date Noted   History of abnormal Pap smear 02/17/2012   History of IBS 02/17/2012   Normal pregnancy 09/13/2010    PCP: Ralene Ok, MD  REFERRING PROVIDER:    REFERRING DIAG: M62.9 (ICD-10-CM) - Disorder of muscle, unspecified  THERAPY DIAG:  Muscle weakness (generalized)  Unspecified lack of coordination  ONSET DATE: 04/09/2021  SUBJECTIVE:                                                                                                                                                                                           SUBJECTIVE STATEMENT: Pt states that she has struggled with pelvic floor for several years at this point and has been for treatment several times. She feels like her biggest issues is urinary incontinence that has been getting worse over the last couple of months. She will go several days without issues and then leak 4x in one day without any known difference. She tries stopping/breathing and holding without improvement in urgency. She also reports pain surrounding lower core with abdomen, hips, and low back. She did have COVID in January and feels like this may have impacted her  increase in symptoms - her activity level has gone down.  Fluid intake: Yes: 4-5 28oz bottles of water; tea in the morning/afternoon (caffeinated)  does not feel like caffeine impacts her symptoms and she has tracked before  Patient confirms identification and approves PT to assess pelvic floor and treatment Yes   PAIN:  Are you having pain? Yes dull ache on Rt side (SI area) - can alternate sides and sometimes in the front NPRS scale: 3/10   PRECAUTIONS: None  WEIGHT BEARING RESTRICTIONS No  FALLS:  Has  patient fallen in last 6 months? No  LIVING ENVIRONMENT: Lives with: lives with their family Lives in: House/apartment  OCCUPATION: full time, seated  PLOF: Independent  PATIENT GOALS decrease urinary leaking  PERTINENT HISTORY:  G2P2 Sexual abuse: No  BOWEL MOVEMENT Pain with bowel movement: No Type of bowel movement:Frequency 1-2/day and Strain Yes Fully empty rectum: No - last couple of months does not feel like she's emptying Leakage: No Pads: No Fiber supplement: No  URINATION Pain with urination: No Fully empty bladder: No - not always, no post-void dribbling Stream:  WNL Urgency: Yes: - Frequency: 2-3 hours Leakage: Urge to void, Coughing, Sneezing, Laughing, and Exercise - has to brace to prevent leaking - avoids jumping activities Pads: No  INTERCOURSE Pain with intercourse:  no pain vaginally, but some low back/hip pain depending on position Ability to have vaginal penetration:  Yes: - Climax: yes   PREGNANCY Vaginal deliveries 2 (13 and 10) Tearing Yes: episiotomy and tear C-section deliveries 0 Currently pregnant No     OBJECTIVE:    COGNITION:  Overall cognitive status: Within functional limits for tasks assessed     SENSATION:  Light touch: Appears intact  Proprioception: Appears intact  GAIT: WNL   PELVIC MMT:  2/5, endurance 8 seconds at 2/5 strength, repetitions 6         PALPATION:   General  minimal diastasis recti  without distortion, core weakness with difficulty performing sit-up                External Perineal Exam WNL                             Internal Pelvic Floor    Restriction of urethra, worse with Rt mobilization; reported sharpness bil urethra     TONE: WNL  PROLAPSE: Anterior vaginal wall laxity grade 2 Posterior vaginal wall laxity grade 1  TODAY'S TREATMENT 07/07/21 EVAL  Manual: Soft tissue mobilization: Scar tissue mobilization: Myofascial release: Spinal mobilization: Internal pelvic floor techniques: No emotional/communication barriers or cognitive limitation. Patient is motivated to learn. Patient understands and agrees with treatment goals and plan. PT explains patient will be examined in standing, sitting, and lying down to see how their muscles and joints work. When they are ready, they will be asked to remove their underwear so PT can examine their perineum. The patient is also given the option of providing their own chaperone as one is not provided in our facility. The patient also has the right and is explained the right to defer or refuse any part of the evaluation or treatment including the internal exam. With the patient's consent, PT will use one gloved finger to gently assess the muscles of the pelvic floor, seeing how well it contracts and relaxes and if there is muscle symmetry. After, the patient will get dressed and PT and patient will discuss exam findings and plan of care. PT and patient discuss plan of care, schedule, attendance policy and HEP activities.  Dry needling: Neuromuscular re-education: Core retraining:  Core facilitation: Form correction: Pelvic floor contraction training: Quick flicks 10x Long holds 5 x 10 sec Down training: Exercises: Stretches/mobility: Strengthening: Therapeutic activities: Functional strengthening activities: Self-care: Abdominal pressure management Perineal splinting during bowel movements Urge suppression  technique    PATIENT EDUCATION:  Education details: See above self care Person educated: Patient Education method: Explanation, Demonstration, Tactile cues, Verbal cues, and Handouts Education comprehension: verbalized understanding   HOME  EXERCISE PROGRAM: [email or web address]  ASSESSMENT:  CLINICAL IMPRESSION: Patient is a 41 y.o. female who was seen today for physical therapy evaluation and treatment for urinary incontinence and pelvic pain. Exam findings notable for core weakness (no diastasis recti/distortion), tenderness at urethra bil, pelvic floor weakness 2/5, decreased pelvic floor endurance 8 seconds, and anterior/posterior vaginal wall laxity. Signs and symptoms are most consistent with pelvic floor and core weakness. Initial treatment consisted of pelvic floor contraction training and lifestyle modifications to help improve urinary urgency/incontinence. She will benefit from skilled PT intervention in order to reduce urinary urgency/incontinence, decrease pelvic/low back pain, and improve QOL.    OBJECTIVE IMPAIRMENTS decreased activity tolerance, decreased coordination, decreased endurance, decreased mobility, decreased ROM, decreased strength, increased muscle spasms, impaired flexibility, postural dysfunction, and pain.   ACTIVITY LIMITATIONS  community activities, exercise, playing with kids .   PERSONAL FACTORS 1 comorbidity: G2P2  are also affecting patient's functional outcome.    REHAB POTENTIAL: Good  CLINICAL DECISION MAKING: Stable/uncomplicated  EVALUATION COMPLEXITY: Low   GOALS: Goals reviewed with patient? Yes  SHORT TERM GOALS: Target date: 08/04/2021  Pt will be independent with HEP.   Baseline: Goal status: INITIAL  2.  Pt will be able to correctly perform diaphragmatic breathing and appropriate pressure management in order to prevent worsening vaginal wall laxity and improve pelvic floor A/ROM.   Baseline:  Goal status: INITIAL  3.  Pt will be able  to teach back and utilize urge suppression technique in order to help reduce number of trips to the bathroom.    Baseline:  Goal status: INITIAL   LONG TERM GOALS: Target date: 09/29/2021  Pt will be independent with advanced HEP.   Baseline:  Goal status: INITIAL  2.  Pt will demonstrate normal pelvic floor muscle tone and A/ROM, able to achieve 4/5 strength with contractions and 10 sec endurance, in order to provide appropriate lumbopelvic support in functional activities.   Baseline:  Goal status: INITIAL  3.  Pt will demonstrate increase in all impaired hip strength by 2 muscle grades in order to demonstrate improved lumbopelvic support and increase functional ability.   Baseline:  Goal status: INITIAL  4.  Pt will report no episodes of urinary incontinence in order to improve confidence in community activities, exercise, and playing with children.    Baseline:  Goal status: INITIAL    PLAN: PT FREQUENCY: 1x/week  PT DURATION: 12 weeks  PLANNED INTERVENTIONS: Therapeutic exercises, Therapeutic activity, Neuromuscular re-education, Balance training, Gait training, Patient/Family education, Joint mobilization, Dry Needling, Biofeedback, and Manual therapy  PLAN FOR NEXT SESSION: Plan to assess low back/hip/pelvic pain; begin core training with pelvic floor and breath coordination.    Julio Alm, PT, DPT05/01/231:11 PM

## 2021-07-24 ENCOUNTER — Ambulatory Visit: Payer: PRIVATE HEALTH INSURANCE

## 2021-07-24 DIAGNOSIS — M6281 Muscle weakness (generalized): Secondary | ICD-10-CM

## 2021-07-24 DIAGNOSIS — R279 Unspecified lack of coordination: Secondary | ICD-10-CM

## 2021-07-24 DIAGNOSIS — M629 Disorder of muscle, unspecified: Secondary | ICD-10-CM | POA: Diagnosis not present

## 2021-07-24 DIAGNOSIS — M62838 Other muscle spasm: Secondary | ICD-10-CM

## 2021-07-24 NOTE — Therapy (Signed)
OUTPATIENT PHYSICAL THERAPY TREATMENT NOTE   Patient Name: Alyssa Vazquez MRN: [id number] DOB:[birth year], 41 y.o., female Today's Date: 07/24/2021  PCP: Ralene Ok, MD REFERRING PROVIDER: Silverio Lay, MD  END OF SESSION:   PT End of Session - 07/24/21 1107     Visit Number 2    Date for PT Re-Evaluation 09/29/21    Authorization Type [employer]    PT Start Time 1105    PT Stop Time 1143    PT Time Calculation (min) 38 min    Activity Tolerance Patient tolerated treatment well    Behavior During Therapy WFL for tasks assessed/performed             Past Medical History:  Diagnosis Date   History of abnormal Pap smear 02/17/2012   Abnormal in 1997; repeats have been normal   IBS (irritable bowel syndrome)    Past Surgical History:  Procedure Laterality Date   WISDOM TOOTH EXTRACTION  2001   Patient Active Problem List   Diagnosis Date Noted   History of abnormal Pap smear 02/17/2012   History of IBS 02/17/2012   Normal pregnancy 09/13/2010    REFERRING DIAG: M62.9 (ICD-10-CM) - Disorder of muscle, unspecified  THERAPY DIAG:  Muscle weakness (generalized)  Unspecified lack of coordination  Other muscle spasm  PERTINENT HISTORY: G2P2  PRECAUTIONS: NA  SUBJECTIVE: Pt states that she has been working on exercises regularly in various positions. She states that she has tried splinting and has not had any luck with it. She has not had any episodes of incontinence in the last couple of weeks, but that may not mean anything due to inconsistency of symptoms. She is still having sporadic Lt sided back/hip discomfort that was worse with walking last night.   PAIN:  Are you having pain? No   SUBJECTIVE STATEMENT: Pt states that she has struggled with pelvic floor for several years at this point and has been for treatment several times. She feels like her biggest issues is urinary incontinence that has been getting worse over the last couple of months. She will go  several days without issues and then leak 4x in one day without any known difference. She tries stopping/breathing and holding without improvement in urgency. She also reports pain surrounding lower core with abdomen, hips, and low back. She did have COVID in January and feels like this may have impacted her increase in symptoms - her activity level has gone down.  Fluid intake: Yes: 4-5 28oz bottles of water; tea in the morning/afternoon (caffeinated)  does not feel like caffeine impacts her symptoms and she has tracked before   Patient confirms identification and approves PT to assess pelvic floor and treatment Yes     PAIN:  Are you having pain? Yes dull ache on Rt side (SI area) - can alternate sides and sometimes in the front NPRS scale: 3/10     PRECAUTIONS: None   WEIGHT BEARING RESTRICTIONS No   FALLS:  Has patient fallen in last 6 months? No   LIVING ENVIRONMENT: Lives with: lives with their family Lives in: House/apartment   OCCUPATION: full time, seated   PLOF: Independent   PATIENT GOALS decrease urinary leaking   PERTINENT HISTORY:  G2P2 Sexual abuse: No   BOWEL MOVEMENT Pain with bowel movement: No Type of bowel movement:Frequency 1-2/day and Strain Yes Fully empty rectum: No - last couple of months does not feel like she's emptying Leakage: No Pads: No Fiber supplement: No   URINATION Pain  with urination: No Fully empty bladder: No - not always, no post-void dribbling Stream:  WNL Urgency: Yes: - Frequency: 2-3 hours Leakage: Urge to void, Coughing, Sneezing, Laughing, and Exercise - has to brace to prevent leaking - avoids jumping activities Pads: No   INTERCOURSE Pain with intercourse:  no pain vaginally, but some low back/hip pain depending on position Ability to have vaginal penetration:  Yes: - Climax: yes     PREGNANCY Vaginal deliveries 2 (13 and 10) Tearing Yes: episiotomy and tear C-section deliveries 0 Currently pregnant No          OBJECTIVE:      07/07/21: COGNITION:            Overall cognitive status: Within functional limits for tasks assessed                          SENSATION:            Light touch: Appears intact            Proprioception: Appears intact   GAIT: WNL     PELVIC MMT:  2/5, endurance 8 seconds at 2/5 strength, repetitions 6           PALPATION:   General  minimal diastasis recti without distortion, core weakness with difficulty performing sit-up                 External Perineal Exam WNL                             Internal Pelvic Floor                          Restriction of urethra, worse with Rt mobilization; reported sharpness bil urethra                            TONE: WNL   PROLAPSE: Anterior vaginal wall laxity grade 2 Posterior vaginal wall laxity grade 1   TODAY'S TREATMENT 07/24/21: Manual: Soft tissue mobilization: L5-S1 lumbar paraspinals and bil glutes Scar tissue mobilization: Myofascial release: Spinal mobilization: Internal pelvic floor techniques: Dry needling: Verbal consent provided by patient L5-S1 lumbar paraspinals and bil glutes Neuromuscular re-education: Core retraining:  Transversus abdominus training with multimodal cues and breath coordination; discussed performing with pelvic floor contraction Core facilitation: Supine march 2 x 10 Leg extensions 10x bil Form correction: Pelvic floor contraction training: Down training: Exercises: Stretches/mobility: Cat/cow 2 x 10 Open books 10x bil Strengthening: Therapeutic activities: Functional strengthening activities: Self-care:    TREATMENT 07/07/21 EVAL  Manual: Soft tissue mobilization: Scar tissue mobilization: Myofascial release: Spinal mobilization: Internal pelvic floor techniques: No emotional/communication barriers or cognitive limitation. Patient is motivated to learn. Patient understands and agrees with treatment goals and plan. PT explains patient will be examined in  standing, sitting, and lying down to see how their muscles and joints work. When they are ready, they will be asked to remove their underwear so PT can examine their perineum. The patient is also given the option of providing their own chaperone as one is not provided in our facility. The patient also has the right and is explained the right to defer or refuse any part of the evaluation or treatment including the internal exam. With the patient's consent, PT will use one gloved finger to gently assess the muscles  of the pelvic floor, seeing how well it contracts and relaxes and if there is muscle symmetry. After, the patient will get dressed and PT and patient will discuss exam findings and plan of care. PT and patient discuss plan of care, schedule, attendance policy and HEP activities.   Dry needling: Neuromuscular re-education: Core retraining:  Core facilitation: Form correction: Pelvic floor contraction training: Quick flicks 10x Long holds 5 x 10 sec Down training: Exercises: Stretches/mobility: Strengthening: Therapeutic activities: Functional strengthening activities: Self-care: Abdominal pressure management Perineal splinting during bowel movements Urge suppression technique       PATIENT EDUCATION:  Education details: Exercise progressions Person educated: Patient Education method: Programmer, multimedia, Demonstration, Tactile cues, Verbal cues, and Handouts Education comprehension: verbalized understanding     HOME EXERCISE PROGRAM: [id number]   ASSESSMENT:   CLINICAL IMPRESSION: Pt possibly seeing some progress with incontinence with no episodes in the last two weeks, but could also be too early to tell at this point in time. Due to persistent hip/LB pain, DN and soft tissue mobilization performed to help reduce restriction and pain with excellent tolerance and improvements. She did well with mobility exercises and core retraining. She will benefit from skilled PT intervention in  order to reduce urinary urgency/incontinence, decrease pelvic/low back pain, and improve QOL.      OBJECTIVE IMPAIRMENTS decreased activity tolerance, decreased coordination, decreased endurance, decreased mobility, decreased ROM, decreased strength, increased muscle spasms, impaired flexibility, postural dysfunction, and pain.    ACTIVITY LIMITATIONS  community activities, exercise, playing with kids .    PERSONAL FACTORS 1 comorbidity: G2P2  are also affecting patient's functional outcome.      REHAB POTENTIAL: Good   CLINICAL DECISION MAKING: Stable/uncomplicated   EVALUATION COMPLEXITY: Low     GOALS: Goals reviewed with patient? Yes   SHORT TERM GOALS: Target date: 08/04/2021   Pt will be independent with HEP.    Baseline: Goal status: INITIAL   2.  Pt will be able to correctly perform diaphragmatic breathing and appropriate pressure management in order to prevent worsening vaginal wall laxity and improve pelvic floor A/ROM.    Baseline:  Goal status: INITIAL   3.  Pt will be able to teach back and utilize urge suppression technique in order to help reduce number of trips to the bathroom.     Baseline:  Goal status: INITIAL     LONG TERM GOALS: Target date: 09/29/2021   Pt will be independent with advanced HEP.    Baseline:  Goal status: INITIAL   2.  Pt will demonstrate normal pelvic floor muscle tone and A/ROM, able to achieve 4/5 strength with contractions and 10 sec endurance, in order to provide appropriate lumbopelvic support in functional activities.    Baseline:  Goal status: INITIAL   3.  Pt will demonstrate increase in all impaired hip strength by 2 muscle grades in order to demonstrate improved lumbopelvic support and increase functional ability.    Baseline:  Goal status: INITIAL   4.  Pt will report no episodes of urinary incontinence in order to improve confidence in community activities, exercise, and playing with children.      Baseline:   Goal status: INITIAL       PLAN: PT FREQUENCY: 1x/week   PT DURATION: 12 weeks   PLANNED INTERVENTIONS: Therapeutic exercises, Therapeutic activity, Neuromuscular re-education, Balance training, Gait training, Patient/Family education, Joint mobilization, Dry Needling, Biofeedback, and Manual therapy   PLAN FOR NEXT SESSION: Plan to assess low back/hip/pelvic pain; begin core  training with pelvic floor and breath coordination.    Julio Alm, PT, DPT05/18/2311:44 AM

## 2021-07-24 NOTE — Patient Instructions (Signed)

## 2021-07-28 ENCOUNTER — Ambulatory Visit: Payer: PRIVATE HEALTH INSURANCE

## 2021-07-28 DIAGNOSIS — M62838 Other muscle spasm: Secondary | ICD-10-CM

## 2021-07-28 DIAGNOSIS — M6281 Muscle weakness (generalized): Secondary | ICD-10-CM

## 2021-07-28 DIAGNOSIS — M629 Disorder of muscle, unspecified: Secondary | ICD-10-CM | POA: Diagnosis not present

## 2021-07-28 DIAGNOSIS — R279 Unspecified lack of coordination: Secondary | ICD-10-CM

## 2021-07-28 NOTE — Therapy (Signed)
OUTPATIENT PHYSICAL THERAPY TREATMENT NOTE   Patient Name: LAJUANA PATCHELL MRN: [id number] DOB:1980/10/02, 41 y.o., female Today's Date: 07/28/2021  PCP: Ralene Ok, MD REFERRING PROVIDER: Silverio Lay, MD  END OF SESSION:   PT End of Session - 07/28/21 0802     Visit Number 3    Date for PT Re-Evaluation 09/29/21    Authorization Type [employer]    PT Start Time 0800    PT Stop Time 0840    PT Time Calculation (min) 40 min    Activity Tolerance Patient tolerated treatment well    Behavior During Therapy [facility] for tasks assessed/performed              Past Medical History:  Diagnosis Date   History of abnormal Pap smear 02/17/2012   Abnormal in 1997; repeats have been normal   IBS (irritable bowel syndrome)    Past Surgical History:  Procedure Laterality Date   WISDOM TOOTH EXTRACTION  2001   Patient Active Problem List   Diagnosis Date Noted   History of abnormal Pap smear 02/17/2012   History of IBS 02/17/2012   Normal pregnancy 09/13/2010    REFERRING DIAG: M62.9 (ICD-10-CM) - Disorder of muscle, unspecified  THERAPY DIAG:  Muscle weakness (generalized)  Unspecified lack of coordination  Other muscle spasm  PERTINENT HISTORY: G2P2  PRECAUTIONS: NA  SUBJECTIVE: Pt states that she did have more pain than normal last night and this morning in Rt hip. Patient reports no episodes of urinary leaking. She is realizing how much clenching she does of her pelvic floor when she is trying to perform pelvic floor contractions/urge suppression technique.   PAIN:  Are you having pain? No   SUBJECTIVE STATEMENT: Pt states that she has struggled with pelvic floor for several years at this point and has been for treatment several times. She feels like her biggest issues is urinary incontinence that has been getting worse over the last couple of months. She will go several days without issues and then leak 4x in one day without any known difference. She tries  stopping/breathing and holding without improvement in urgency. She also reports pain surrounding lower core with abdomen, hips, and low back. She did have COVID in January and feels like this may have impacted her increase in symptoms - her activity level has gone down.  Fluid intake: Yes: 4-5 28oz bottles of water; tea in the morning/afternoon (caffeinated)  does not feel like caffeine impacts her symptoms and she has tracked before   Patient confirms identification and approves PT to assess pelvic floor and treatment Yes     PAIN:  Are you having pain? Yes dull ache on Rt side (SI area) - can alternate sides and sometimes in the front NPRS scale: 3/10     PRECAUTIONS: None   WEIGHT BEARING RESTRICTIONS No   FALLS:  Has patient fallen in last 6 months? No   LIVING ENVIRONMENT: Lives with: lives with their family Lives in: House/apartment   OCCUPATION: full time, seated   PLOF: Independent   PATIENT GOALS decrease urinary leaking   PERTINENT HISTORY:  G2P2 Sexual abuse: No   BOWEL MOVEMENT Pain with bowel movement: No Type of bowel movement:Frequency 1-2/day and Strain Yes Fully empty rectum: No - last couple of months does not feel like she's emptying Leakage: No Pads: No Fiber supplement: No   URINATION Pain with urination: No Fully empty bladder: No - not always, no post-void dribbling Stream:  WNL Urgency: Yes: - Frequency:  2-3 hours Leakage: Urge to void, Coughing, Sneezing, Laughing, and Exercise - has to brace to prevent leaking - avoids jumping activities Pads: No   INTERCOURSE Pain with intercourse:  no pain vaginally, but some low back/hip pain depending on position Ability to have vaginal penetration:  Yes: - Climax: yes     PREGNANCY Vaginal deliveries 2 (13 and 10) Tearing Yes: episiotomy and tear C-section deliveries 0 Currently pregnant No         OBJECTIVE:      07/07/21: COGNITION:            Overall cognitive status: Within  functional limits for tasks assessed                          SENSATION:            Light touch: Appears intact            Proprioception: Appears intact   GAIT: WNL     PELVIC MMT:  2/5, endurance 8 seconds at 2/5 strength, repetitions 6           PALPATION:   General  minimal diastasis recti without distortion, core weakness with difficulty performing sit-up                 External Perineal Exam WNL                             Internal Pelvic Floor                          Restriction of urethra, worse with Rt mobilization; reported sharpness bil urethra                            TONE: WNL   PROLAPSE: Anterior vaginal wall laxity grade 2 Posterior vaginal wall laxity grade 1   TODAY'S TREATMENT 07/28/21: Manual: Soft tissue mobilization: L5-S1 lumbar paraspinals and bil glutes Scar tissue mobilization: Myofascial release: Spinal mobilization: Internal pelvic floor techniques: Dry needling: Verbal consent provided by patient L5-S1 lumbar paraspinals and bil glutes Neuromuscular re-education: Core retraining:  Core facilitation: Seated horizontal abduction 2 x 10 green band Form correction: Pelvic floor contraction training: Down training: Exercises: Stretches/mobility: Piriformis stretch variations 10 minutes  Strengthening: Clam shell 2 x 10  Bridge with hip adduction 2 x 10 Squats 2 x 10 with breath/core/pelvic floor coordination Therapeutic activities: Functional strengthening activities: Self-care:    TREATMENT 07/24/21: Manual: Soft tissue mobilization: L5-S1 lumbar paraspinals and bil glutes Scar tissue mobilization: Myofascial release: Spinal mobilization: Internal pelvic floor techniques: Dry needling: Verbal consent provided by patient L5-S1 lumbar paraspinals and bil glutes Neuromuscular re-education: Core retraining:  Transversus abdominus training with multimodal cues and breath coordination; discussed performing with pelvic floor  contraction Core facilitation: Supine march 2 x 10 Leg extensions 10x bil Form correction: Pelvic floor contraction training: Down training: Exercises: Stretches/mobility: Cat/cow 2 x 10 Open books 10x bil Strengthening: Therapeutic activities: Functional strengthening activities: Self-care:    TREATMENT 07/07/21 EVAL  Manual: Soft tissue mobilization: Scar tissue mobilization: Myofascial release: Spinal mobilization: Internal pelvic floor techniques: No emotional/communication barriers or cognitive limitation. Patient is motivated to learn. Patient understands and agrees with treatment goals and plan. PT explains patient will be examined in standing, sitting, and lying down to see how their muscles and joints work. When  they are ready, they will be asked to remove their underwear so PT can examine their perineum. The patient is also given the option of providing their own chaperone as one is not provided in our facility. The patient also has the right and is explained the right to defer or refuse any part of the evaluation or treatment including the internal exam. With the patient's consent, PT will use one gloved finger to gently assess the muscles of the pelvic floor, seeing how well it contracts and relaxes and if there is muscle symmetry. After, the patient will get dressed and PT and patient will discuss exam findings and plan of care. PT and patient discuss plan of care, schedule, attendance policy and HEP activities.   Dry needling: Neuromuscular re-education: Core retraining:  Core facilitation: Form correction: Pelvic floor contraction training: Quick flicks 10x Long holds 5 x 10 sec Down training: Exercises: Stretches/mobility: Strengthening: Therapeutic activities: Functional strengthening activities: Self-care: Abdominal pressure management Perineal splinting during bowel movements Urge suppression technique       PATIENT EDUCATION:  Education details:  Exercise progressions Person educated: Patient Education method: Programmer, multimediaxplanation, Demonstration, ActorTactile cues, Verbal cues, and Handouts Education comprehension: verbalized understanding     HOME EXERCISE PROGRAM: [id number]   ASSESSMENT:   CLINICAL IMPRESSION: Pt still has not had any episodes of urinary leaking since beginning PT, building stronger evidence that contractions/exercises are having impact. DN continued to Rt hip due to increase in pain over the weekend; she demonstrated good release of soft tissue restriction with no increase in pain. Core and hip strengthening progressions to help encourage pelvic floor A/ROM and strengthening. We discussed importance of relaxation in addition to strengthening. Overall reduction in pain at end of treatment session. She will benefit from skilled PT intervention in order to reduce urinary urgency/incontinence, decrease pelvic/low back pain, and improve QOL.      OBJECTIVE IMPAIRMENTS decreased activity tolerance, decreased coordination, decreased endurance, decreased mobility, decreased ROM, decreased strength, increased muscle spasms, impaired flexibility, postural dysfunction, and pain.    ACTIVITY LIMITATIONS  community activities, exercise, playing with kids .    PERSONAL FACTORS 1 comorbidity: G2P2  are also affecting patient's functional outcome.      REHAB POTENTIAL: Good   CLINICAL DECISION MAKING: Stable/uncomplicated   EVALUATION COMPLEXITY: Low     GOALS: Goals reviewed with patient? Yes   SHORT TERM GOALS: Target date: 08/04/2021   Pt will be independent with HEP.    Baseline: Goal status: INITIAL   2.  Pt will be able to correctly perform diaphragmatic breathing and appropriate pressure management in order to prevent worsening vaginal wall laxity and improve pelvic floor A/ROM.    Baseline:  Goal status: INITIAL   3.  Pt will be able to teach back and utilize urge suppression technique in order to help reduce number of  trips to the bathroom.     Baseline:  Goal status: INITIAL     LONG TERM GOALS: Target date: 09/29/2021   Pt will be independent with advanced HEP.    Baseline:  Goal status: INITIAL   2.  Pt will demonstrate normal pelvic floor muscle tone and A/ROM, able to achieve 4/5 strength with contractions and 10 sec endurance, in order to provide appropriate lumbopelvic support in functional activities.    Baseline:  Goal status: INITIAL   3.  Pt will demonstrate increase in all impaired hip strength by 2 muscle grades in order to demonstrate improved lumbopelvic support and increase functional ability.  Baseline:  Goal status: INITIAL   4.  Pt will report no episodes of urinary incontinence in order to improve confidence in community activities, exercise, and playing with children.      Baseline:  Goal status: INITIAL       PLAN: PT FREQUENCY: 1x/week   PT DURATION: 12 weeks   PLANNED INTERVENTIONS: Therapeutic exercises, Therapeutic activity, Neuromuscular re-education, Balance training, Gait training, Patient/Family education, Joint mobilization, Dry Needling, Biofeedback, and Manual therapy   PLAN FOR NEXT SESSION: Plan to assess low back/hip/pelvic pain; begin core training with pelvic floor and breath coordination.    Julio Alm, PT, DPT05/22/238:45 AM

## 2021-08-06 ENCOUNTER — Ambulatory Visit: Payer: PRIVATE HEALTH INSURANCE

## 2021-08-06 NOTE — Therapy (Incomplete)
OUTPATIENT PHYSICAL THERAPY TREATMENT NOTE   Patient Name: Alyssa Vazquez MRN: [id number] DOB:August 21, 1980, 41 y.o., female Today's Date: 08/06/2021  PCP: Ralene Ok, MD REFERRING PROVIDER: Silverio Lay, MD  END OF SESSION:      Past Medical History:  Diagnosis Date   History of abnormal Pap smear 02/17/2012   Abnormal in 1997; repeats have been normal   IBS (irritable bowel syndrome)    Past Surgical History:  Procedure Laterality Date   WISDOM TOOTH EXTRACTION  2001   Patient Active Problem List   Diagnosis Date Noted   History of abnormal Pap smear 02/17/2012   History of IBS 02/17/2012   Normal pregnancy 09/13/2010    REFERRING DIAG: M62.9 (ICD-10-CM) - Disorder of muscle, unspecified  THERAPY DIAG:  No diagnosis found.  PERTINENT HISTORY: G2P2  PRECAUTIONS: NA  SUBJECTIVE: Pt states that she did have more pain than normal last night and this morning in Rt hip. Patient reports no episodes of urinary leaking. She is realizing how much clenching she does of her pelvic floor when she is trying to perform pelvic floor contractions/urge suppression technique.   PAIN:  Are you having pain? No   SUBJECTIVE STATEMENT: Pt states that she has struggled with pelvic floor for several years at this point and has been for treatment several times. She feels like her biggest issues is urinary incontinence that has been getting worse over the last couple of months. She will go several days without issues and then leak 4x in one day without any known difference. She tries stopping/breathing and holding without improvement in urgency. She also reports pain surrounding lower core with abdomen, hips, and low back. She did have COVID in January and feels like this may have impacted her increase in symptoms - her activity level has gone down.  Fluid intake: Yes: 4-5 28oz bottles of water; tea in the morning/afternoon (caffeinated)  does not feel like caffeine impacts her symptoms  and she has tracked before   Patient confirms identification and approves PT to assess pelvic floor and treatment Yes     PAIN:  Are you having pain? Yes dull ache on Rt side (SI area) - can alternate sides and sometimes in the front NPRS scale: 3/10     PRECAUTIONS: None   WEIGHT BEARING RESTRICTIONS No   FALLS:  Has patient fallen in last 6 months? No   LIVING ENVIRONMENT: Lives with: lives with their family Lives in: House/apartment   OCCUPATION: full time, seated   PLOF: Independent   PATIENT GOALS decrease urinary leaking   PERTINENT HISTORY:  G2P2 Sexual abuse: No   BOWEL MOVEMENT Pain with bowel movement: No Type of bowel movement:Frequency 1-2/day and Strain Yes Fully empty rectum: No - last couple of months does not feel like she's emptying Leakage: No Pads: No Fiber supplement: No   URINATION Pain with urination: No Fully empty bladder: No - not always, no post-void dribbling Stream:  WNL Urgency: Yes: - Frequency: 2-3 hours Leakage: Urge to void, Coughing, Sneezing, Laughing, and Exercise - has to brace to prevent leaking - avoids jumping activities Pads: No   INTERCOURSE Pain with intercourse:  no pain vaginally, but some low back/hip pain depending on position Ability to have vaginal penetration:  Yes: - Climax: yes     PREGNANCY Vaginal deliveries 2 (13 and 10) Tearing Yes: episiotomy and tear C-section deliveries 0 Currently pregnant No         OBJECTIVE:      07/07/21:  COGNITION:            Overall cognitive status: Within functional limits for tasks assessed                          SENSATION:            Light touch: Appears intact            Proprioception: Appears intact   GAIT: WNL     PELVIC MMT:  2/5, endurance 8 seconds at 2/5 strength, repetitions 6           PALPATION:   General  minimal diastasis recti without distortion, core weakness with difficulty performing sit-up                 External Perineal Exam  WNL                             Internal Pelvic Floor                          Restriction of urethra, worse with Rt mobilization; reported sharpness bil urethra                            TONE: WNL   PROLAPSE: Anterior vaginal wall laxity grade 2 Posterior vaginal wall laxity grade 1   TODAY'S TREATMENT 08/06/21: Manual: Soft tissue mobilization: Scar tissue mobilization: Myofascial release: Spinal mobilization: Internal pelvic floor techniques: Dry needling: Neuromuscular re-education: Core retraining:  Core facilitation: Form correction: Pelvic floor contraction training: Down training: Exercises: Stretches/mobility: Strengthening: Therapeutic activities: Functional strengthening activities: Self-care:    TREATMENT 07/28/21: Manual: Soft tissue mobilization: L5-S1 lumbar paraspinals and bil glutes Scar tissue mobilization: Myofascial release: Spinal mobilization: Internal pelvic floor techniques: Dry needling: Verbal consent provided by patient L5-S1 lumbar paraspinals and bil glutes Neuromuscular re-education: Core retraining:  Core facilitation: Seated horizontal abduction 2 x 10 green band Form correction: Pelvic floor contraction training: Down training: Exercises: Stretches/mobility: Piriformis stretch variations 10 minutes  Strengthening: Clam shell 2 x 10  Bridge with hip adduction 2 x 10 Squats 2 x 10 with breath/core/pelvic floor coordination Therapeutic activities: Functional strengthening activities: Self-care:    TREATMENT 07/24/21: Manual: Soft tissue mobilization: L5-S1 lumbar paraspinals and bil glutes Scar tissue mobilization: Myofascial release: Spinal mobilization: Internal pelvic floor techniques: Dry needling: Verbal consent provided by patient L5-S1 lumbar paraspinals and bil glutes Neuromuscular re-education: Core retraining:  Transversus abdominus training with multimodal cues and breath coordination; discussed  performing with pelvic floor contraction Core facilitation: Supine march 2 x 10 Leg extensions 10x bil Form correction: Pelvic floor contraction training: Down training: Exercises: Stretches/mobility: Cat/cow 2 x 10 Open books 10x bil Strengthening: Therapeutic activities: Functional strengthening activities: Self-care:         PATIENT EDUCATION:  Education details: Exercise progressions Person educated: Patient Education method: Programmer, multimedia, Demonstration, Actor cues, Verbal cues, and Handouts Education comprehension: verbalized understanding     HOME EXERCISE PROGRAM: [email or web address]   ASSESSMENT:   CLINICAL IMPRESSION: She will benefit from skilled PT intervention in order to reduce urinary urgency/incontinence, decrease pelvic/low back pain, and improve QOL.      OBJECTIVE IMPAIRMENTS decreased activity tolerance, decreased coordination, decreased endurance, decreased mobility, decreased ROM, decreased strength, increased muscle spasms, impaired flexibility, postural dysfunction, and pain.    ACTIVITY LIMITATIONS  community activities, exercise, playing  with kids .    PERSONAL FACTORS 1 comorbidity: G2P2  are also affecting patient's functional outcome.      REHAB POTENTIAL: Good   CLINICAL DECISION MAKING: Stable/uncomplicated   EVALUATION COMPLEXITY: Low     GOALS: Goals reviewed with patient? Yes   SHORT TERM GOALS: Target date: 08/04/2021   Pt will be independent with HEP.    Baseline: Goal status: INITIAL   2.  Pt will be able to correctly perform diaphragmatic breathing and appropriate pressure management in order to prevent worsening vaginal wall laxity and improve pelvic floor A/ROM.    Baseline:  Goal status: INITIAL   3.  Pt will be able to teach back and utilize urge suppression technique in order to help reduce number of trips to the bathroom.     Baseline:  Goal status: INITIAL     LONG TERM GOALS: Target date: 09/29/2021   Pt will be  independent with advanced HEP.    Baseline:  Goal status: INITIAL   2.  Pt will demonstrate normal pelvic floor muscle tone and A/ROM, able to achieve 4/5 strength with contractions and 10 sec endurance, in order to provide appropriate lumbopelvic support in functional activities.    Baseline:  Goal status: INITIAL   3.  Pt will demonstrate increase in all impaired hip strength by 2 muscle grades in order to demonstrate improved lumbopelvic support and increase functional ability.    Baseline:  Goal status: INITIAL   4.  Pt will report no episodes of urinary incontinence in order to improve confidence in community activities, exercise, and playing with children.      Baseline:  Goal status: INITIAL       PLAN: PT FREQUENCY: 1x/week   PT DURATION: 12 weeks   PLANNED INTERVENTIONS: Therapeutic exercises, Therapeutic activity, Neuromuscular re-education, Balance training, Gait training, Patient/Family education, Joint mobilization, Dry Needling, Biofeedback, and Manual therapy   PLAN FOR NEXT SESSION: Plan to assess low back/hip/pelvic pain; begin core training with pelvic floor and breath coordination.    Julio AlmKristen Fiorella Hanahan, PT, DPT05/31/237:59 AM

## 2021-08-12 ENCOUNTER — Ambulatory Visit: Payer: PRIVATE HEALTH INSURANCE | Attending: Obstetrics and Gynecology

## 2021-08-12 DIAGNOSIS — M6281 Muscle weakness (generalized): Secondary | ICD-10-CM | POA: Diagnosis present

## 2021-08-12 DIAGNOSIS — R279 Unspecified lack of coordination: Secondary | ICD-10-CM | POA: Diagnosis present

## 2021-08-12 DIAGNOSIS — M62838 Other muscle spasm: Secondary | ICD-10-CM | POA: Insufficient documentation

## 2021-08-12 NOTE — Therapy (Signed)
OUTPATIENT PHYSICAL THERAPY TREATMENT NOTE   Patient Name: Alyssa Vazquez MRN: [id number] DOB:26-Feb-1981, 41 y.o., female Today's Date: 08/12/2021  PCP: Jilda Panda, MD REFERRING PROVIDER: Delsa Bern, MD  END OF SESSION:   PT End of Session - 08/12/21 1148     Visit Number 4    Date for PT Re-Evaluation 09/29/21    Authorization Type [employer]    PT Start Time 1145    PT Stop Time 1225    PT Time Calculation (min) 40 min    Activity Tolerance Patient tolerated treatment well    Behavior During Therapy [facility] for tasks assessed/performed              Past Medical History:  Diagnosis Date   History of abnormal Pap smear 02/17/2012   Abnormal in 1997; repeats have been normal   IBS (irritable bowel syndrome)    Past Surgical History:  Procedure Laterality Date   WISDOM TOOTH EXTRACTION  2001   Patient Active Problem List   Diagnosis Date Noted   History of abnormal Pap smear 02/17/2012   History of IBS 02/17/2012   Normal pregnancy 09/13/2010    REFERRING DIAG: M62.9 (ICD-10-CM) - Disorder of muscle, unspecified  THERAPY DIAG:  Muscle weakness (generalized)  Unspecified lack of coordination  Other muscle spasm  PERTINENT HISTORY: G2P2  PRECAUTIONS: NA  SUBJECTIVE: Patient did have 2 episodes of urinary leaking in the last two weeks. She states that after DN she did have some relief, then several days of more significant pain. Now she has not had any pain for about a week.   PAIN:  Are you having pain? No   SUBJECTIVE STATEMENT: Pt states that she has struggled with pelvic floor for several years at this point and has been for treatment several times. She feels like her biggest issues is urinary incontinence that has been getting worse over the last couple of months. She will go several days without issues and then leak 4x in one day without any known difference. She tries stopping/breathing and holding without improvement in urgency. She also reports  pain surrounding lower core with abdomen, hips, and low back. She did have COVID in January and feels like this may have impacted her increase in symptoms - her activity level has gone down.  Fluid intake: Yes: 4-5 28oz bottles of water; tea in the morning/afternoon (caffeinated)  does not feel like caffeine impacts her symptoms and she has tracked before   Patient confirms identification and approves PT to assess pelvic floor and treatment Yes     PAIN:  Are you having pain? Yes dull ache on Rt side (SI area) - can alternate sides and sometimes in the front NPRS scale: 3/10     PRECAUTIONS: None   WEIGHT BEARING RESTRICTIONS No   FALLS:  Has patient fallen in last 6 months? No   LIVING ENVIRONMENT: Lives with: lives with their family Lives in: House/apartment   OCCUPATION: full time, seated   PLOF: Independent   PATIENT GOALS decrease urinary leaking   PERTINENT HISTORY:  G2P2 Sexual abuse: No   BOWEL MOVEMENT Pain with bowel movement: No Type of bowel movement:Frequency 1-2/day and Strain Yes Fully empty rectum: No - last couple of months does not feel like she's emptying Leakage: No Pads: No Fiber supplement: No   URINATION Pain with urination: No Fully empty bladder: No - not always, no post-void dribbling Stream:  WNL Urgency: Yes: - Frequency: 2-3 hours Leakage: Urge to void, Coughing,  Sneezing, Laughing, and Exercise - has to brace to prevent leaking - avoids jumping activities Pads: No   INTERCOURSE Pain with intercourse:  no pain vaginally, but some low back/hip pain depending on position Ability to have vaginal penetration:  Yes: - Climax: yes     PREGNANCY Vaginal deliveries 2 (13 and 10) Tearing Yes: episiotomy and tear C-section deliveries 0 Currently pregnant No         OBJECTIVE:      07/07/21: COGNITION:            Overall cognitive status: Within functional limits for tasks assessed                          SENSATION:             Light touch: Appears intact            Proprioception: Appears intact   GAIT: WNL     PELVIC MMT:  2/5, endurance 8 seconds at 2/5 strength, repetitions 6           PALPATION:   General  minimal diastasis recti without distortion, core weakness with difficulty performing sit-up                 External Perineal Exam WNL                             Internal Pelvic Floor                          Restriction of urethra, worse with Rt mobilization; reported sharpness bil urethra                            TONE: WNL   PROLAPSE: Anterior vaginal wall laxity grade 2 Posterior vaginal wall laxity grade 1   TODAY'S TREATMENT 08/12/21 Manual: Internal pelvic floor techniques: No emotional/communication barriers or cognitive limitation. Patient is motivated to learn. Patient understands and agrees with treatment goals and plan. PT explains patient will be examined in standing, sitting, and lying down to see how their muscles and joints work. When they are ready, they will be asked to remove their underwear so PT can examine their perineum. The patient is also given the option of providing their own chaperone as one is not provided in our facility. The patient also has the right and is explained the right to defer or refuse any part of the evaluation or treatment including the internal exam. With the patient's consent, PT will use one gloved finger to gently assess the muscles of the pelvic floor, seeing how well it contracts and relaxes and if there is muscle symmetry. After, the patient will get dressed and PT and patient will discuss exam findings and plan of care. PT and patient discuss plan of care, schedule, attendance policy and HEP activities. Urethral/bladder mobilization bil via internal vaginal exam Release of Bil superficial pelvic floor vaginally Neuromuscular re-education: Core facilitation: Unilateral rows with rotation, 10x bil, green band Pallof press, 10x bil, red band Bil UE  extension 2 x 10, green band Pelvic floor contraction training: 2 x 10 quick flicks with multimodal cues for improved isolation and motor control with breath coordination   TREATMENT 07/28/21: Manual: Soft tissue mobilization: L5-S1 lumbar paraspinals and bil glutes Scar tissue mobilization: Myofascial release: Spinal mobilization: Internal pelvic floor techniques:  Dry needling: Verbal consent provided by patient L5-S1 lumbar paraspinals and bil glutes Neuromuscular re-education: Core retraining:  Core facilitation: Seated horizontal abduction 2 x 10 green band Form correction: Pelvic floor contraction training: Down training: Exercises: Stretches/mobility: Piriformis stretch variations 10 minutes  Strengthening: Clam shell 2 x 10  Bridge with hip adduction 2 x 10 Squats 2 x 10 with breath/core/pelvic floor coordination Therapeutic activities: Functional strengthening activities: Self-care:    TREATMENT 07/24/21: Manual: Soft tissue mobilization: L5-S1 lumbar paraspinals and bil glutes Scar tissue mobilization: Myofascial release: Spinal mobilization: Internal pelvic floor techniques: Dry needling: Verbal consent provided by patient L5-S1 lumbar paraspinals and bil glutes Neuromuscular re-education: Core retraining:  Transversus abdominus training with multimodal cues and breath coordination; discussed performing with pelvic floor contraction Core facilitation: Supine march 2 x 10 Leg extensions 10x bil Form correction: Pelvic floor contraction training: Down training: Exercises: Stretches/mobility: Cat/cow 2 x 10 Open books 10x bil Strengthening: Therapeutic activities: Functional strengthening activities: Self-care:      PATIENT EDUCATION:  Education details: Exercise progressions Person educated: Patient Education method: Consulting civil engineer, Demonstration, Corporate treasurer cues, Verbal cues, and Handouts Education comprehension: verbalized understanding      HOME EXERCISE PROGRAM: [email or web address]   ASSESSMENT:   CLINICAL IMPRESSION: Pt had two big episodes of urinary incontinence for unknown reasons in the last two weeks. She has been performing much more consistent HEP as well.  She continues to demonstrate increased urethral restriction, possibly due to combination of pelvic floor clenching and anterior vaginal wall laxity. Urethral mobilizations performed to help improve mobility with good tolerance and no discomfort reported after. Pelvic floor contractions performed and patient contracting more core and Bil LE than pelvic floor when trying to coordinate with breath; good improvements with decreasing amount of effort and focusing contraction to pelvic floor. She was able to progress core exercises with good functional use of pelvic floor and breath. She was encouraged to work on awareness of pelvic tension throughout the day. She will benefit from skilled PT intervention in order to reduce urinary urgency/incontinence, decrease pelvic/low back pain, and improve QOL.      OBJECTIVE IMPAIRMENTS decreased activity tolerance, decreased coordination, decreased endurance, decreased mobility, decreased ROM, decreased strength, increased muscle spasms, impaired flexibility, postural dysfunction, and pain.    ACTIVITY LIMITATIONS  community activities, exercise, playing with kids .    PERSONAL FACTORS 1 comorbidity: G2P2  are also affecting patient's functional outcome.      REHAB POTENTIAL: Good   CLINICAL DECISION MAKING: Stable/uncomplicated   EVALUATION COMPLEXITY: Low     GOALS: Goals reviewed with patient? Yes   SHORT TERM GOALS: Target date: 08/04/2021   Pt will be independent with HEP.    Baseline: Goal status: MET 08/12/21   2.  Pt will be able to correctly perform diaphragmatic breathing and appropriate pressure management in order to prevent worsening vaginal wall laxity and improve pelvic floor A/ROM.    Baseline:  Goal status: MET  08/12/21   3.  Pt will be able to teach back and utilize urge suppression technique in order to help reduce number of trips to the bathroom.     Baseline:  Goal status: MET 08/12/21     LONG TERM GOALS: Target date: 09/29/2021   Pt will be independent with advanced HEP.    Baseline:  Goal status: IN PROGRESS 08/12/21   2.  Pt will demonstrate normal pelvic floor muscle tone and A/ROM, able to achieve 4/5 strength with contractions and 10 sec endurance, in order  to provide appropriate lumbopelvic support in functional activities.    Baseline:  Goal status: IN PROGRESS 08/12/21   3.  Pt will demonstrate increase in all impaired hip strength by 2 muscle grades in order to demonstrate improved lumbopelvic support and increase functional ability.    Baseline:  Goal status: IN PROGRESS 08/12/21   4.  Pt will report no episodes of urinary incontinence in order to improve confidence in community activities, exercise, and playing with children.      Baseline:  Goal status: IN PROGRESS 08/12/21       PLAN: PT FREQUENCY: 1x/week   PT DURATION: 12 weeks   PLANNED INTERVENTIONS: Therapeutic exercises, Therapeutic activity, Neuromuscular re-education, Balance training, Gait training, Patient/Family education, Joint mobilization, Dry Needling, Biofeedback, and Manual therapy   PLAN FOR NEXT SESSION: Progress core/pelvic floor strengthening and mobility exercises to tolerance.    Heather Roberts, PT, DPT06/08/2310:43 PM

## 2021-08-19 ENCOUNTER — Ambulatory Visit: Payer: PRIVATE HEALTH INSURANCE

## 2021-08-19 DIAGNOSIS — R279 Unspecified lack of coordination: Secondary | ICD-10-CM

## 2021-08-19 DIAGNOSIS — M6281 Muscle weakness (generalized): Secondary | ICD-10-CM | POA: Diagnosis not present

## 2021-08-19 DIAGNOSIS — M62838 Other muscle spasm: Secondary | ICD-10-CM

## 2021-08-19 NOTE — Therapy (Signed)
OUTPATIENT PHYSICAL THERAPY TREATMENT NOTE   Patient Name: Alyssa Vazquez MRN: [id number] DOB:1980/04/19, 41 y.o., female Today's Date: 08/19/2021  PCP: Jilda Panda, MD REFERRING PROVIDER: Delsa Bern, MD  END OF SESSION:   PT End of Session - 08/19/21 0850     Visit Number 5    Date for PT Re-Evaluation 09/29/21    Authorization Type [employer]    PT Start Time 0849    PT Stop Time 0927    PT Time Calculation (min) 38 min    Activity Tolerance Patient tolerated treatment well    Behavior During Therapy [facility] for tasks assessed/performed               Past Medical History:  Diagnosis Date   History of abnormal Pap smear 02/17/2012   Abnormal in 1997; repeats have been normal   IBS (irritable bowel syndrome)    Past Surgical History:  Procedure Laterality Date   WISDOM TOOTH EXTRACTION  2001   Patient Active Problem List   Diagnosis Date Noted   History of abnormal Pap smear 02/17/2012   History of IBS 02/17/2012   Normal pregnancy 09/13/2010    REFERRING DIAG: M62.9 (ICD-10-CM) - Disorder of muscle, unspecified  THERAPY DIAG:  Muscle weakness (generalized)  Unspecified lack of coordination  Other muscle spasm  PERTINENT HISTORY: G2P2  PRECAUTIONS: NA  SUBJECTIVE: Patient states that she has only done exercises a couple of times. She has not had any pain except with rolling in bed. She has been very mindful of deep breathing and releasing tension throughout the day.   PAIN:  Are you having pain? Yes: NPRS scale: 0/10 Pain location: Lt low back Pain description: digging Aggravating factors: rolling in bed Relieving factors: lying still   SUBJECTIVE STATEMENT: Pt states that she has struggled with pelvic floor for several years at this point and has been for treatment several times. She feels like her biggest issues is urinary incontinence that has been getting worse over the last couple of months. She will go several days without issues and then  leak 4x in one day without any known difference. She tries stopping/breathing and holding without improvement in urgency. She also reports pain surrounding lower core with abdomen, hips, and low back. She did have COVID in January and feels like this may have impacted her increase in symptoms - her activity level has gone down.  Fluid intake: Yes: 4-5 28oz bottles of water; tea in the morning/afternoon (caffeinated)  does not feel like caffeine impacts her symptoms and she has tracked before   Patient confirms identification and approves PT to assess pelvic floor and treatment Yes     PAIN:  Are you having pain? Yes dull ache on Rt side (SI area) - can alternate sides and sometimes in the front NPRS scale: 3/10     PRECAUTIONS: None   WEIGHT BEARING RESTRICTIONS No   FALLS:  Has patient fallen in last 6 months? No   LIVING ENVIRONMENT: Lives with: lives with their family Lives in: House/apartment   OCCUPATION: full time, seated   PLOF: Independent   PATIENT GOALS decrease urinary leaking   PERTINENT HISTORY:  G2P2 Sexual abuse: No   BOWEL MOVEMENT Pain with bowel movement: No Type of bowel movement:Frequency 1-2/day and Strain Yes Fully empty rectum: No - last couple of months does not feel like she's emptying Leakage: No Pads: No Fiber supplement: No   URINATION Pain with urination: No Fully empty bladder: No - not always,  no post-void dribbling Stream:  WNL Urgency: Yes: - Frequency: 2-3 hours Leakage: Urge to void, Coughing, Sneezing, Laughing, and Exercise - has to brace to prevent leaking - avoids jumping activities Pads: No   INTERCOURSE Pain with intercourse:  no pain vaginally, but some low back/hip pain depending on position Ability to have vaginal penetration:  Yes: - Climax: yes     PREGNANCY Vaginal deliveries 2 (13 and 10) Tearing Yes: episiotomy and tear C-section deliveries 0 Currently pregnant No         OBJECTIVE:       07/07/21: COGNITION:            Overall cognitive status: Within functional limits for tasks assessed                          SENSATION:            Light touch: Appears intact            Proprioception: Appears intact   GAIT: WNL     PELVIC MMT:  2/5, endurance 8 seconds at 2/5 strength, repetitions 6           PALPATION:   General  minimal diastasis recti without distortion, core weakness with difficulty performing sit-up                 External Perineal Exam WNL                             Internal Pelvic Floor                          Restriction of urethra, worse with Rt mobilization; reported sharpness bil urethra                            TONE: WNL   PROLAPSE: Anterior vaginal wall laxity grade 2 Posterior vaginal wall laxity grade 1   TODAY'S TREATMENT 08/19/21: Exercises: Stretches/mobility: Wide leg thoracic openers with yoga block 10x Kneeling side lunge stretch with forearms on yoga block 10 breaths bil Strengthening: Squats with pelvic floor contraction 10x Squats with anterior weight hold 5 lbs, 2 x 10 Sidestepping 2 laps with red band 3-way kick with red band 10x ea, bil Self-care: Poise impressa - when to use, sizing kit given, providing extra support for vaginal walls   TREATMENT 08/12/21 Manual: Internal pelvic floor techniques: No emotional/communication barriers or cognitive limitation. Patient is motivated to learn. Patient understands and agrees with treatment goals and plan. PT explains patient will be examined in standing, sitting, and lying down to see how their muscles and joints work. When they are ready, they will be asked to remove their underwear so PT can examine their perineum. The patient is also given the option of providing their own chaperone as one is not provided in our facility. The patient also has the right and is explained the right to defer or refuse any part of the evaluation or treatment including the internal exam. With the  patient's consent, PT will use one gloved finger to gently assess the muscles of the pelvic floor, seeing how well it contracts and relaxes and if there is muscle symmetry. After, the patient will get dressed and PT and patient will discuss exam findings and plan of care. PT and patient discuss plan of care, schedule, attendance  policy and HEP activities. Urethral/bladder mobilization bil via internal vaginal exam Release of Bil superficial pelvic floor vaginally Neuromuscular re-education: Core facilitation: Unilateral rows with rotation, 10x bil, green band Pallof press, 10x bil, red band Bil UE extension 2 x 10, green band Pelvic floor contraction training: 2 x 10 quick flicks with multimodal cues for improved isolation and motor control with breath coordination   TREATMENT 07/28/21: Manual: Soft tissue mobilization: L5-S1 lumbar paraspinals and bil glutes Scar tissue mobilization: Myofascial release: Spinal mobilization: Internal pelvic floor techniques: Dry needling: Verbal consent provided by patient L5-S1 lumbar paraspinals and bil glutes Neuromuscular re-education: Core retraining:  Core facilitation: Seated horizontal abduction 2 x 10 green band Form correction: Pelvic floor contraction training: Down training: Exercises: Stretches/mobility: Piriformis stretch variations 10 minutes  Strengthening: Clam shell 2 x 10  Bridge with hip adduction 2 x 10 Squats 2 x 10 with breath/core/pelvic floor coordination Therapeutic activities: Functional strengthening activities: Self-care:     PATIENT EDUCATION:  Education details: Poise impressa discussion - samples given; HEP updated Person educated: Patient Education method: Explanation, Demonstration, Tactile cues, Verbal cues, and Handouts Education comprehension: verbalized understanding     HOME EXERCISE PROGRAM: [email or web address]   ASSESSMENT:   CLINICAL IMPRESSION: Patient did very well after last treatment session  with no pain or episodes of urinary incontinence. However, when diving into symptoms a little further, she states that she does have some baseline low back pain with rolling in bed that is still present; we discussed that this is most likely still related to deep core function and stability. She did very well with addition of mobility exercises to help improve anterior abdominal/bladder/urethral mobility and strengthening progressions with no episodes of leaking or increase in pain. She will benefit from skilled PT intervention in order to reduce urinary urgency/incontinence, decrease pelvic/low back pain, and improve QOL.      OBJECTIVE IMPAIRMENTS decreased activity tolerance, decreased coordination, decreased endurance, decreased mobility, decreased ROM, decreased strength, increased muscle spasms, impaired flexibility, postural dysfunction, and pain.    ACTIVITY LIMITATIONS  community activities, exercise, playing with kids .    PERSONAL FACTORS 1 comorbidity: G2P2  are also affecting patient's functional outcome.      REHAB POTENTIAL: Good   CLINICAL DECISION MAKING: Stable/uncomplicated   EVALUATION COMPLEXITY: Low     GOALS: Goals reviewed with patient? Yes   SHORT TERM GOALS: Target date: 08/04/2021   Pt will be independent with HEP.    Baseline: Goal status: MET 08/12/21   2.  Pt will be able to correctly perform diaphragmatic breathing and appropriate pressure management in order to prevent worsening vaginal wall laxity and improve pelvic floor A/ROM.    Baseline:  Goal status: MET 08/12/21   3.  Pt will be able to teach back and utilize urge suppression technique in order to help reduce number of trips to the bathroom.     Baseline:  Goal status: MET 08/12/21     LONG TERM GOALS: Target date: 09/29/2021   Pt will be independent with advanced HEP.    Baseline:  Goal status: IN PROGRESS 08/12/21   2.  Pt will demonstrate normal pelvic floor muscle tone and A/ROM, able to  achieve 4/5 strength with contractions and 10 sec endurance, in order to provide appropriate lumbopelvic support in functional activities.    Baseline:  Goal status: IN PROGRESS 08/12/21   3.  Pt will demonstrate increase in all impaired hip strength by 2 muscle grades in order to demonstrate improved  lumbopelvic support and increase functional ability.    Baseline:  Goal status: IN PROGRESS 08/12/21   4.  Pt will report no episodes of urinary incontinence in order to improve confidence in community activities, exercise, and playing with children.      Baseline:  Goal status: IN PROGRESS 08/12/21       PLAN: PT FREQUENCY: 1x/week   PT DURATION: 12 weeks   PLANNED INTERVENTIONS: Therapeutic exercises, Therapeutic activity, Neuromuscular re-education, Balance training, Gait training, Patient/Family education, Joint mobilization, Dry Needling, Biofeedback, and Manual therapy   PLAN FOR NEXT SESSION: Progress core/pelvic floor strengthening and mobility exercises to tolerance.    Heather Roberts, PT, DPT06/13/239:28 AM

## 2021-08-25 ENCOUNTER — Ambulatory Visit: Payer: PRIVATE HEALTH INSURANCE

## 2021-08-25 DIAGNOSIS — M6281 Muscle weakness (generalized): Secondary | ICD-10-CM

## 2021-08-25 DIAGNOSIS — M62838 Other muscle spasm: Secondary | ICD-10-CM

## 2021-08-25 DIAGNOSIS — R279 Unspecified lack of coordination: Secondary | ICD-10-CM

## 2021-08-25 NOTE — Therapy (Signed)
OUTPATIENT PHYSICAL THERAPY TREATMENT NOTE   Patient Name: Alyssa Vazquez MRN: [id number] DOB:[birth year], 41 y.o., female Today's Date: 08/25/2021  PCP: Jilda Panda, MD REFERRING PROVIDER: Delsa Bern, MD  END OF SESSION:   PT End of Session - 08/25/21 0850     Visit Number 6    Date for PT Re-Evaluation 09/29/21    Authorization Type [employer]    PT Start Time 0847    PT Stop Time 0927    PT Time Calculation (min) 40 min    Activity Tolerance Patient tolerated treatment well    Behavior During Therapy [facility] for tasks assessed/performed                Past Medical History:  Diagnosis Date   History of abnormal Pap smear 02/17/2012   Abnormal in 1997; repeats have been normal   IBS (irritable bowel syndrome)    Past Surgical History:  Procedure Laterality Date   WISDOM TOOTH EXTRACTION  2001   Patient Active Problem List   Diagnosis Date Noted   History of abnormal Pap smear 02/17/2012   History of IBS 02/17/2012   Normal pregnancy 09/13/2010    REFERRING DIAG: M62.9 (ICD-10-CM) - Disorder of muscle, unspecified  THERAPY DIAG:  Muscle weakness (generalized)  Unspecified lack of coordination  Other muscle spasm  PERTINENT HISTORY: G2P2  PRECAUTIONS: NA  SUBJECTIVE: Patient states that she went backpacking over the weekend and did not have any specific issues with incontinence or Low back/hip pain, but is feeling very sore. She did not have a chance to use poise impressa yesterday due to getting home much later than anticipated.   PAIN:  Are you having pain? Yes: NPRS scale: 0/10 Pain location: Lt low back Pain description: digging Aggravating factors: rolling in bed Relieving factors: lying still   SUBJECTIVE STATEMENT: Pt states that she has struggled with pelvic floor for several years at this point and has been for treatment several times. She feels like her biggest issues is urinary incontinence that has been getting worse over the last  couple of months. She will go several days without issues and then leak 4x in one day without any known difference. She tries stopping/breathing and holding without improvement in urgency. She also reports pain surrounding lower core with abdomen, hips, and low back. She did have COVID in January and feels like this may have impacted her increase in symptoms - her activity level has gone down.  Fluid intake: Yes: 4-5 28oz bottles of water; tea in the morning/afternoon (caffeinated)  does not feel like caffeine impacts her symptoms and she has tracked before   Patient confirms identification and approves PT to assess pelvic floor and treatment Yes     PAIN:  Are you having pain? Yes dull ache on Rt side (SI area) - can alternate sides and sometimes in the front NPRS scale: 3/10     PRECAUTIONS: None   WEIGHT BEARING RESTRICTIONS No   FALLS:  Has patient fallen in last 6 months? No   LIVING ENVIRONMENT: Lives with: lives with their family Lives in: House/apartment   OCCUPATION: full time, seated   PLOF: Independent   PATIENT GOALS decrease urinary leaking   PERTINENT HISTORY:  G2P2 Sexual abuse: No   BOWEL MOVEMENT Pain with bowel movement: No Type of bowel movement:Frequency 1-2/day and Strain Yes Fully empty rectum: No - last couple of months does not feel like she's emptying Leakage: No Pads: No Fiber supplement: No   URINATION Pain  with urination: No Fully empty bladder: No - not always, no post-void dribbling Stream:  WNL Urgency: Yes: - Frequency: 2-3 hours Leakage: Urge to void, Coughing, Sneezing, Laughing, and Exercise - has to brace to prevent leaking - avoids jumping activities Pads: No   INTERCOURSE Pain with intercourse:  no pain vaginally, but some low back/hip pain depending on position Ability to have vaginal penetration:  Yes: - Climax: yes     PREGNANCY Vaginal deliveries 2 (13 and 10) Tearing Yes: episiotomy and tear C-section deliveries  0 Currently pregnant No         OBJECTIVE:      07/07/21: COGNITION:            Overall cognitive status: Within functional limits for tasks assessed                          SENSATION:            Light touch: Appears intact            Proprioception: Appears intact   GAIT: WNL     PELVIC MMT:  2/5, endurance 8 seconds at 2/5 strength, repetitions 6           PALPATION:   General  minimal diastasis recti without distortion, core weakness with difficulty performing sit-up                 External Perineal Exam WNL                             Internal Pelvic Floor                          Restriction of urethra, worse with Rt mobilization; reported sharpness bil urethra                            TONE: WNL   PROLAPSE: Anterior vaginal wall laxity grade 2 Posterior vaginal wall laxity grade 1   TODAY'S TREATMENT 08/25/21 Manual: Soft tissue mobilization: Abdominal Rt posterolateral Dry needling: Trigger Point Dry-Needling  Treatment instructions: Expect mild to moderate muscle soreness. S/S of pneumothorax if dry needled over a lung field, and to seek immediate medical attention should they occur. Patient verbalized understanding of these instructions and education.  Patient Consent Given: Yes Education handout provided: Previously provided Muscles treated: Rt glutes/piriformis/TFL Electrical stimulation performed: No Parameters: N/A Treatment response/outcome: twitch response and release Exercises: Stretches/mobility: Supine piriformis stretch 2 min  Pigeon pose 2 min Kneeling hip flexor stretch 60 sec bil   TREATMENT 08/19/21: Exercises: Stretches/mobility: Wide leg thoracic openers with yoga block 10x Kneeling side lunge stretch with forearms on yoga block 10 breaths bil Strengthening: Squats with pelvic floor contraction 10x Squats with anterior weight hold 5 lbs, 2 x 10 Sidestepping 2 laps with red band 3-way kick with red band 10x ea,  bil Self-care: Poise impressa - when to use, sizing kit given, providing extra support for vaginal walls   TREATMENT 08/12/21 Manual: Internal pelvic floor techniques: No emotional/communication barriers or cognitive limitation. Patient is motivated to learn. Patient understands and agrees with treatment goals and plan. PT explains patient will be examined in standing, sitting, and lying down to see how their muscles and joints work. When they are ready, they will be asked to remove their underwear so PT can examine  their perineum. The patient is also given the option of providing their own chaperone as one is not provided in our facility. The patient also has the right and is explained the right to defer or refuse any part of the evaluation or treatment including the internal exam. With the patient's consent, PT will use one gloved finger to gently assess the muscles of the pelvic floor, seeing how well it contracts and relaxes and if there is muscle symmetry. After, the patient will get dressed and PT and patient will discuss exam findings and plan of care. PT and patient discuss plan of care, schedule, attendance policy and HEP activities. Urethral/bladder mobilization bil via internal vaginal exam Release of Bil superficial pelvic floor vaginally Neuromuscular re-education: Core facilitation: Unilateral rows with rotation, 10x bil, green band Pallof press, 10x bil, red band Bil UE extension 2 x 10, green band Pelvic floor contraction training: 2 x 10 quick flicks with multimodal cues for improved isolation and motor control with breath coordination     PATIENT EDUCATION:  Education details: Exercise progressions Person educated: Patient Education method: Consulting civil engineer, Demonstration, Tactile cues, Verbal cues, and Handouts Education comprehension: verbalized understanding     HOME EXERCISE PROGRAM: [email or web address]   ASSESSMENT:   CLINICAL IMPRESSION: Manual techniques performed to  bladder/abdomen and Rt hip in order to help reduce restriction and soreness. She tolerated all techniques well and reported notable improvement in soreness at end of session. Mobility exercises performed to help further this change. We discussed performing gentle strengthening today. She did not have any urinary incontinence issues while backpacking, demonstrating possible improvements. We will see how she does the next several days being more fatigued - she may try poise impressa. She will benefit from skilled PT intervention in order to reduce urinary urgency/incontinence, decrease pelvic/low back pain, and improve QOL.      OBJECTIVE IMPAIRMENTS decreased activity tolerance, decreased coordination, decreased endurance, decreased mobility, decreased ROM, decreased strength, increased muscle spasms, impaired flexibility, postural dysfunction, and pain.    ACTIVITY LIMITATIONS  community activities, exercise, playing with kids .    PERSONAL FACTORS 1 comorbidity: G2P2  are also affecting patient's functional outcome.      REHAB POTENTIAL: Good   CLINICAL DECISION MAKING: Stable/uncomplicated   EVALUATION COMPLEXITY: Low     GOALS: Goals reviewed with patient? Yes   SHORT TERM GOALS: Target date: 08/04/2021   Pt will be independent with HEP.    Baseline: Goal status: MET 08/12/21   2.  Pt will be able to correctly perform diaphragmatic breathing and appropriate pressure management in order to prevent worsening vaginal wall laxity and improve pelvic floor A/ROM.    Baseline:  Goal status: MET 08/12/21   3.  Pt will be able to teach back and utilize urge suppression technique in order to help reduce number of trips to the bathroom.     Baseline:  Goal status: MET 08/12/21     LONG TERM GOALS: Target date: 09/29/2021   Pt will be independent with advanced HEP.    Baseline:  Goal status: IN PROGRESS 08/12/21   2.  Pt will demonstrate normal pelvic floor muscle tone and A/ROM, able to  achieve 4/5 strength with contractions and 10 sec endurance, in order to provide appropriate lumbopelvic support in functional activities.    Baseline:  Goal status: IN PROGRESS 08/12/21   3.  Pt will demonstrate increase in all impaired hip strength by 2 muscle grades in order to demonstrate improved lumbopelvic support and increase  functional ability.    Baseline:  Goal status: IN PROGRESS 08/12/21   4.  Pt will report no episodes of urinary incontinence in order to improve confidence in community activities, exercise, and playing with children.      Baseline:  Goal status: IN PROGRESS 08/12/21       PLAN: PT FREQUENCY: 1x/week   PT DURATION: 12 weeks   PLANNED INTERVENTIONS: Therapeutic exercises, Therapeutic activity, Neuromuscular re-education, Balance training, Gait training, Patient/Family education, Joint mobilization, Dry Needling, Biofeedback, and Manual therapy   PLAN FOR NEXT SESSION: Progress core/pelvic floor strengthening and mobility exercises to tolerance.    Heather Roberts, PT, DPT06/19/239:30 AM

## 2021-09-04 ENCOUNTER — Ambulatory Visit: Payer: PRIVATE HEALTH INSURANCE

## 2021-09-10 ENCOUNTER — Ambulatory Visit: Payer: PRIVATE HEALTH INSURANCE | Attending: Obstetrics and Gynecology

## 2021-09-10 DIAGNOSIS — R279 Unspecified lack of coordination: Secondary | ICD-10-CM | POA: Insufficient documentation

## 2021-09-10 DIAGNOSIS — M6281 Muscle weakness (generalized): Secondary | ICD-10-CM | POA: Insufficient documentation

## 2021-09-10 DIAGNOSIS — M62838 Other muscle spasm: Secondary | ICD-10-CM | POA: Diagnosis present

## 2021-09-10 NOTE — Patient Instructions (Signed)
Stool softener: cut back and then consider stopping  Increase fiber: 8-12 grams with each meal  Lubricants: try coconu or silicone based  Talk with MD about possible benefit of topical hormone treatment to vulva - can call [facility] [address] [place], [place] [address] Phone # [phone] Fax [phone]

## 2021-09-10 NOTE — Therapy (Signed)
OUTPATIENT PHYSICAL THERAPY TREATMENT NOTE   Patient Name: Alyssa Vazquez MRN: [id number] DOB:08/24/1980, 41 y.o., female Today's Date: 09/10/2021  PCP: Jilda Panda, MD REFERRING PROVIDER: Delsa Bern, MD  END OF SESSION:   PT End of Session - 09/10/21 1449     Visit Number 7    Date for PT Re-Evaluation 09/29/21    Authorization Type [employer]    PT Start Time 1445    PT Stop Time 1525    PT Time Calculation (min) 40 min    Activity Tolerance Patient tolerated treatment well    Behavior During Therapy [facility] for tasks assessed/performed                 Past Medical History:  Diagnosis Date   History of abnormal Pap smear 02/17/2012   Abnormal in 1997; repeats have been normal   IBS (irritable bowel syndrome)    Past Surgical History:  Procedure Laterality Date   WISDOM TOOTH EXTRACTION  2001   Patient Active Problem List   Diagnosis Date Noted   History of abnormal Pap smear 02/17/2012   History of IBS 02/17/2012   Normal pregnancy 09/13/2010    REFERRING DIAG: M62.9 (ICD-10-CM) - Disorder of muscle, unspecified  THERAPY DIAG:  Muscle weakness (generalized)  Unspecified lack of coordination  Other muscle spasm  PERTINENT HISTORY: G2P2  PRECAUTIONS: NA  SUBJECTIVE: Pt states that she had to cancel appointment last week due to BV and having a lot of discomfort. She states that she had just been recovering from yeast infection. She had episode of fecal incontinence yesterday that really surprised her and was upsetting - she has not had before. She has been trying to work on exercises more.   PAIN:  Are you having pain? Yes: NPRS scale: 0/10 Pain location: Lt low back Pain description: digging Aggravating factors: rolling in bed Relieving factors: lying still   SUBJECTIVE STATEMENT: Pt states that she has struggled with pelvic floor for several years at this point and has been for treatment several times. She feels like her biggest issues is  urinary incontinence that has been getting worse over the last couple of months. She will go several days without issues and then leak 4x in one day without any known difference. She tries stopping/breathing and holding without improvement in urgency. She also reports pain surrounding lower core with abdomen, hips, and low back. She did have COVID in January and feels like this may have impacted her increase in symptoms - her activity level has gone down.  Fluid intake: Yes: 4-5 28oz bottles of water; tea in the morning/afternoon (caffeinated)  does not feel like caffeine impacts her symptoms and she has tracked before   Patient confirms identification and approves PT to assess pelvic floor and treatment Yes     PAIN:  Are you having pain? Yes dull ache on Rt side (SI area) - can alternate sides and sometimes in the front NPRS scale: 3/10     PRECAUTIONS: None   WEIGHT BEARING RESTRICTIONS No   FALLS:  Has patient fallen in last 6 months? No   LIVING ENVIRONMENT: Lives with: lives with their family Lives in: House/apartment   OCCUPATION: full time, seated   PLOF: Independent   PATIENT GOALS decrease urinary leaking   PERTINENT HISTORY:  G2P2 Sexual abuse: No   BOWEL MOVEMENT Pain with bowel movement: No Type of bowel movement:Frequency 1-2/day and Strain Yes Fully empty rectum: No - last couple of months does not feel  like she's emptying Leakage: No Pads: No Fiber supplement: No   URINATION Pain with urination: No Fully empty bladder: No - not always, no post-void dribbling Stream:  WNL Urgency: Yes: - Frequency: 2-3 hours Leakage: Urge to void, Coughing, Sneezing, Laughing, and Exercise - has to brace to prevent leaking - avoids jumping activities Pads: No   INTERCOURSE Pain with intercourse:  no pain vaginally, but some low back/hip pain depending on position Ability to have vaginal penetration:  Yes: - Climax: yes     PREGNANCY Vaginal deliveries 2 (11 and  10) Tearing Yes: episiotomy and tear C-section deliveries 0 Currently pregnant No         OBJECTIVE:      07/07/21: COGNITION:            Overall cognitive status: Within functional limits for tasks assessed                          SENSATION:            Light touch: Appears intact            Proprioception: Appears intact   GAIT: WNL     PELVIC MMT:  2/5, endurance 8 seconds at 2/5 strength, repetitions 6           PALPATION:   General  minimal diastasis recti without distortion, core weakness with difficulty performing sit-up                 External Perineal Exam WNL                             Internal Pelvic Floor                          Restriction of urethra, worse with Rt mobilization; reported sharpness bil urethra                            TONE: WNL   PROLAPSE: Anterior vaginal wall laxity grade 2 Posterior vaginal wall laxity grade 1   TODAY'S TREATMENT 09/10/21 Self-care: Discussed vaginal pH and importance of maintaining - she regularly uses boric acid suppositories; she was encouraged to talk with MD about possible benefit of use of estrogen/testosterone cream; encouraged to try v-magic - she states that she is already using Lubricant options Increasing fiber due to loose frequent stools - consider stopping 2 stool softeners a day due to soft and frequent bowel movements HEP questions answered   TREATMENT 08/25/21 Manual: Soft tissue mobilization: Abdominal Rt posterolateral Dry needling: Trigger Point Dry-Needling  Treatment instructions: Expect mild to moderate muscle soreness. S/S of pneumothorax if dry needled over a lung field, and to seek immediate medical attention should they occur. Patient verbalized understanding of these instructions and education.  Patient Consent Given: Yes Education handout provided: Previously provided Muscles treated: Rt glutes/piriformis/TFL Electrical stimulation performed: No Parameters: N/A Treatment  response/outcome: twitch response and release Exercises: Stretches/mobility: Supine piriformis stretch 2 min  Pigeon pose 2 min Kneeling hip flexor stretch 60 sec bil   TREATMENT 08/19/21: Exercises: Stretches/mobility: Wide leg thoracic openers with yoga block 10x Kneeling side lunge stretch with forearms on yoga block 10 breaths bil Strengthening: Squats with pelvic floor contraction 10x Squats with anterior weight hold 5 lbs, 2 x 10 Sidestepping 2 laps with red band 3-way kick  with red band 10x ea, bil Self-care: Poise impressa - when to use, sizing kit given, providing extra support for vaginal walls      PATIENT EDUCATION:  Education details: See above self-care Person educated: Patient Education method: Explanation, Demonstration, Tactile cues, Verbal cues, and Handouts Education comprehension: verbalized understanding     HOME EXERCISE PROGRAM: [email or web address]   ASSESSMENT:   CLINICAL IMPRESSION: Patient had first episode of fecal incontinence yesterday; we discussed fiber, consistency of bowel movements, and toileting position. She reports that she consistently takes 2 stool softeners a day and is wondering if this is contributing to overall bowel dysfunction; we discussed cutting back to these and adding in dietary fiber, focusing on soluble. Due to frequency of vulvar/vaginal infection and generalized vulvar discomfort, she was encouraged to talk with MD about any benefit from trying a topical hormone cream. We reviewed questions that she had regarding HEP and exercises last treatment session. She is seeing progress with urinary incontinence and has not had any episodes in weeks, but is still having some severe urgency. She will benefit from skilled PT intervention in order to reduce urinary urgency/incontinence, decrease pelvic/low back pain, and improve QOL.      OBJECTIVE IMPAIRMENTS decreased activity tolerance, decreased coordination, decreased endurance, decreased  mobility, decreased ROM, decreased strength, increased muscle spasms, impaired flexibility, postural dysfunction, and pain.    ACTIVITY LIMITATIONS  community activities, exercise, playing with kids .    PERSONAL FACTORS 1 comorbidity: G2P2  are also affecting patient's functional outcome.      REHAB POTENTIAL: Good   CLINICAL DECISION MAKING: Stable/uncomplicated   EVALUATION COMPLEXITY: Low     GOALS: Goals reviewed with patient? Yes   SHORT TERM GOALS: Target date: 08/04/2021   Pt will be independent with HEP.    Baseline: Goal status: MET 08/12/21   2.  Pt will be able to correctly perform diaphragmatic breathing and appropriate pressure management in order to prevent worsening vaginal wall laxity and improve pelvic floor A/ROM.    Baseline:  Goal status: MET 08/12/21   3.  Pt will be able to teach back and utilize urge suppression technique in order to help reduce number of trips to the bathroom.     Baseline:  Goal status: MET 08/12/21     LONG TERM GOALS: Target date: 09/29/2021   Pt will be independent with advanced HEP.    Baseline:  Goal status: IN PROGRESS 08/12/21   2.  Pt will demonstrate normal pelvic floor muscle tone and A/ROM, able to achieve 4/5 strength with contractions and 10 sec endurance, in order to provide appropriate lumbopelvic support in functional activities.    Baseline:  Goal status: IN PROGRESS 08/12/21   3.  Pt will demonstrate increase in all impaired hip strength by 2 muscle grades in order to demonstrate improved lumbopelvic support and increase functional ability.    Baseline:  Goal status: IN PROGRESS 08/12/21   4.  Pt will report no episodes of urinary incontinence in order to improve confidence in community activities, exercise, and playing with children.      Baseline:  Goal status: IN PROGRESS 08/12/21       PLAN: PT FREQUENCY: 1x/week   PT DURATION: 12 weeks   PLANNED INTERVENTIONS: Therapeutic exercises, Therapeutic  activity, Neuromuscular re-education, Balance training, Gait training, Patient/Family education, Joint mobilization, Dry Needling, Biofeedback, and Manual therapy   PLAN FOR NEXT SESSION: Progress core/pelvic floor strengthening and mobility exercises to tolerance.    Heather Roberts, PT,  DPT07/05/233:33 PM

## 2021-09-16 ENCOUNTER — Ambulatory Visit: Payer: PRIVATE HEALTH INSURANCE

## 2021-09-16 DIAGNOSIS — M6281 Muscle weakness (generalized): Secondary | ICD-10-CM

## 2021-09-16 DIAGNOSIS — M62838 Other muscle spasm: Secondary | ICD-10-CM

## 2021-09-16 DIAGNOSIS — R279 Unspecified lack of coordination: Secondary | ICD-10-CM

## 2021-09-16 NOTE — Therapy (Signed)
OUTPATIENT PHYSICAL THERAPY TREATMENT NOTE   Patient Name: Alyssa Vazquez MRN: [id number] DOB:21-Oct-1980, 41 y.o., female Today's Date: 09/16/2021  PCP: Jilda Panda, MD REFERRING PROVIDER: Delsa Bern, MD  END OF SESSION:   PT End of Session - 09/16/21 0932     Visit Number 8    Date for PT Re-Evaluation 09/29/21    Authorization Type [employer]    PT Start Time 0931    PT Stop Time 1011    PT Time Calculation (min) 40 min    Activity Tolerance Patient tolerated treatment well    Behavior During Therapy [facility] for tasks assessed/performed                 Past Medical History:  Diagnosis Date   History of abnormal Pap smear 02/17/2012   Abnormal in 1997; repeats have been normal   IBS (irritable bowel syndrome)    Past Surgical History:  Procedure Laterality Date   WISDOM TOOTH EXTRACTION  2001   Patient Active Problem List   Diagnosis Date Noted   History of abnormal Pap smear 02/17/2012   History of IBS 02/17/2012   Normal pregnancy 09/13/2010    REFERRING DIAG: M62.9 (ICD-10-CM) - Disorder of muscle, unspecified  THERAPY DIAG:  Muscle weakness (generalized)  Unspecified lack of coordination  Other muscle spasm  PERTINENT HISTORY: G2P2  PRECAUTIONS: NA  SUBJECTIVE: Pt states that she has not had any issues with incontinence in the last week. She reports a baseline of some things still hurt sometimes. She makes time to move her body every day, but she has not performed HEP since her last visit.   PAIN:  Are you having pain? Yes: NPRS scale: 0/10 Pain location: Lt low back Pain description: digging Aggravating factors: rolling in bed Relieving factors: lying still   SUBJECTIVE STATEMENT: Pt states that she has struggled with pelvic floor for several years at this point and has been for treatment several times. She feels like her biggest issues is urinary incontinence that has been getting worse over the last couple of months. She will go several  days without issues and then leak 4x in one day without any known difference. She tries stopping/breathing and holding without improvement in urgency. She also reports pain surrounding lower core with abdomen, hips, and low back. She did have COVID in January and feels like this may have impacted her increase in symptoms - her activity level has gone down.  Fluid intake: Yes: 4-5 28oz bottles of water; tea in the morning/afternoon (caffeinated)  does not feel like caffeine impacts her symptoms and she has tracked before   Patient confirms identification and approves PT to assess pelvic floor and treatment Yes     PAIN:  Are you having pain? Yes dull ache on Rt side (SI area) - can alternate sides and sometimes in the front NPRS scale: 3/10     PRECAUTIONS: None   WEIGHT BEARING RESTRICTIONS No   FALLS:  Has patient fallen in last 6 months? No   LIVING ENVIRONMENT: Lives with: lives with their family Lives in: House/apartment   OCCUPATION: full time, seated   PLOF: Independent   PATIENT GOALS decrease urinary leaking   PERTINENT HISTORY:  G2P2 Sexual abuse: No   BOWEL MOVEMENT Pain with bowel movement: No Type of bowel movement:Frequency 1-2/day and Strain Yes Fully empty rectum: No - last couple of months does not feel like she's emptying Leakage: No Pads: No Fiber supplement: No   URINATION Pain with  urination: No Fully empty bladder: No - not always, no post-void dribbling Stream:  WNL Urgency: Yes: - Frequency: 2-3 hours Leakage: Urge to void, Coughing, Sneezing, Laughing, and Exercise - has to brace to prevent leaking - avoids jumping activities Pads: No   INTERCOURSE Pain with intercourse:  no pain vaginally, but some low back/hip pain depending on position Ability to have vaginal penetration:  Yes: - Climax: yes     PREGNANCY Vaginal deliveries 2 (13 and 10) Tearing Yes: episiotomy and tear C-section deliveries 0 Currently pregnant No          OBJECTIVE:      07/07/21: COGNITION:            Overall cognitive status: Within functional limits for tasks assessed                          SENSATION:            Light touch: Appears intact            Proprioception: Appears intact   GAIT: WNL     PELVIC MMT:  2/5, endurance 8 seconds at 2/5 strength, repetitions 6           PALPATION:   General  minimal diastasis recti without distortion, core weakness with difficulty performing sit-up                 External Perineal Exam WNL                             Internal Pelvic Floor                          Restriction of urethra, worse with Rt mobilization; reported sharpness bil urethra                            TONE: WNL   PROLAPSE: Anterior vaginal wall laxity grade 2 Posterior vaginal wall laxity grade 1   TODAY'S 09/16/21 Self-care: Extensive discussion about how to be more compliant with HEP/exercises  Goal setting for the next week: getting up earlier to work out; something other than treadmill 2x this week - trying a circuit work out that we made for these two days (sumo squat with bicep curl, deadlift with row, squat press 10x each for 3-4 rounds) Various modes of working out: treadmill weight training, body weight, outdoor, YouTube videos  TREATMENT 09/10/21 Self-care: Discussed vaginal pH and importance of maintaining - she regularly uses boric acid suppositories; she was encouraged to talk with MD about possible benefit of use of estrogen/testosterone cream; encouraged to try v-magic - she states that she is already using Lubricant options Increasing fiber due to loose frequent stools - consider stopping 2 stool softeners a day due to soft and frequent bowel movements HEP questions answered   TREATMENT 08/25/21 Manual: Soft tissue mobilization: Abdominal Rt posterolateral Dry needling: Trigger Point Dry-Needling  Treatment instructions: Expect mild to moderate muscle soreness. S/S of pneumothorax if dry  needled over a lung field, and to seek immediate medical attention should they occur. Patient verbalized understanding of these instructions and education.  Patient Consent Given: Yes Education handout provided: Previously provided Muscles treated: Rt glutes/piriformis/TFL Electrical stimulation performed: No Parameters: N/A Treatment response/outcome: twitch response and release Exercises: Stretches/mobility: Supine piriformis stretch 2 min  Pigeon pose 2  min Kneeling hip flexor stretch 60 sec bil      PATIENT EDUCATION:  Education details: See above self-care Person educated: Patient Education method: Explanation, Demonstration, Tactile cues, Verbal cues, and Handouts Education comprehension: verbalized understanding     HOME EXERCISE PROGRAM: [email or web address]   ASSESSMENT:   CLINICAL IMPRESSION: Due to low level aches and pains that patient continues to have, discussed the importance of strength training more regularly; she continues to have a very hard time incorporating this or HEP in general into daily life. We discussed various boundaries to this and ways to work around. We were able to come up with specific goals for this next week for strength training - 2x/wk she will wake up early and go through above circuit. Believe she will see great progress by performing even small increases in strength training, especially with focus on core/pelvic floor during all functional exercises. Sometimes pulling weights out are a boundary and training with more body weight next session will be helpful. She will benefit from skilled PT intervention in order to reduce urinary urgency/incontinence, decrease pelvic/low back pain, and improve QOL.      OBJECTIVE IMPAIRMENTS decreased activity tolerance, decreased coordination, decreased endurance, decreased mobility, decreased ROM, decreased strength, increased muscle spasms, impaired flexibility, postural dysfunction, and pain.    ACTIVITY LIMITATIONS   community activities, exercise, playing with kids .    PERSONAL FACTORS 1 comorbidity: G2P2  are also affecting patient's functional outcome.      REHAB POTENTIAL: Good   CLINICAL DECISION MAKING: Stable/uncomplicated   EVALUATION COMPLEXITY: Low     GOALS: Goals reviewed with patient? Yes   SHORT TERM GOALS: Target date: 08/04/2021   Pt will be independent with HEP.    Baseline: Goal status: MET 08/12/21   2.  Pt will be able to correctly perform diaphragmatic breathing and appropriate pressure management in order to prevent worsening vaginal wall laxity and improve pelvic floor A/ROM.    Baseline:  Goal status: MET 08/12/21   3.  Pt will be able to teach back and utilize urge suppression technique in order to help reduce number of trips to the bathroom.     Baseline:  Goal status: MET 08/12/21     LONG TERM GOALS: Target date: 09/29/2021   Pt will be independent with advanced HEP.    Baseline:  Goal status: IN PROGRESS 08/12/21   2.  Pt will demonstrate normal pelvic floor muscle tone and A/ROM, able to achieve 4/5 strength with contractions and 10 sec endurance, in order to provide appropriate lumbopelvic support in functional activities.    Baseline:  Goal status: IN PROGRESS 08/12/21   3.  Pt will demonstrate increase in all impaired hip strength by 2 muscle grades in order to demonstrate improved lumbopelvic support and increase functional ability.    Baseline:  Goal status: IN PROGRESS 08/12/21   4.  Pt will report no episodes of urinary incontinence in order to improve confidence in community activities, exercise, and playing with children.      Baseline:  Goal status: IN PROGRESS 08/12/21       PLAN: PT FREQUENCY: 1x/week   PT DURATION: 12 weeks   PLANNED INTERVENTIONS: Therapeutic exercises, Therapeutic activity, Neuromuscular re-education, Balance training, Gait training, Patient/Family education, Joint mobilization, Dry Needling, Biofeedback, and Manual  therapy   PLAN FOR NEXT SESSION: Progress body weight training. Come up with several simple circuits to incorporate into weekly routine. Address goals.    Kristen Steer, PT, DPT07/01/2309:15 AM     

## 2021-09-22 ENCOUNTER — Ambulatory Visit: Payer: PRIVATE HEALTH INSURANCE

## 2021-09-22 DIAGNOSIS — M6281 Muscle weakness (generalized): Secondary | ICD-10-CM

## 2021-09-22 DIAGNOSIS — R279 Unspecified lack of coordination: Secondary | ICD-10-CM

## 2021-09-22 DIAGNOSIS — M62838 Other muscle spasm: Secondary | ICD-10-CM

## 2021-09-22 NOTE — Therapy (Signed)
OUTPATIENT PHYSICAL THERAPY TREATMENT NOTE   Patient Name: Alyssa [place]azquez MRN: [id number] DOB:[birth year], 41 y.o., female Today's Date: 09/22/2021  PCP: Jilda Panda, MD REFERRING PROVIDER: Delsa Bern, MD  END OF SESSION:   PT End of Session - 09/22/21 1233     Visit Number 9    Date for PT Re-Evaluation 09/29/21    Authorization Type [employer]    PT Start Time 1230    PT Stop Time 1310    PT Time Calculation (min) 40 min    Activity Tolerance Patient tolerated treatment well    Behavior During Therapy [facility] for tasks assessed/performed                  Past Medical History:  Diagnosis Date   History of abnormal Pap smear 02/17/2012   Abnormal in 1997; repeats have been normal   IBS (irritable bowel syndrome)    Past Surgical History:  Procedure Laterality Date   WISDOM TOOTH EXTRACTION  2001   Patient Active Problem List   Diagnosis Date Noted   History of abnormal Pap smear 02/17/2012   History of IBS 02/17/2012   Normal pregnancy 09/13/2010    REFERRING DIAG: M62.9 (ICD-10-CM) - Disorder of muscle, unspecified  THERAPY DIAG:  Muscle weakness (generalized)  Unspecified lack of coordination  Other muscle spasm  PERTINENT HISTORY: G2P2  PRECAUTIONS: NA  SUBJECTIVE: Pt states that she did have some leaking yesterday. She is still feeling encouraged overall after last  treatment session to do more strength training. She has been able to get it is 3x this week. She had a little pain with sumo squat and just made her stance a little more narrow. When she had the leaking, she had made herself wait to long - she thinks that it had been 3.5 hours since her last trip to the bathroom.   PAIN:  Are you having pain? Yes: NPRS scale: 0/10 Pain location: Lt low back Pain description: digging Aggravating factors: rolling in bed Relieving factors: lying still   SUBJECTIVE STATEMENT: Pt states that she has struggled with pelvic floor for several years at  this point and has been for treatment several times. She feels like her biggest issues is urinary incontinence that has been getting worse over the last couple of months. She will go several days without issues and then leak 4x in one day without any known difference. She tries stopping/breathing and holding without improvement in urgency. She also reports pain surrounding lower core with abdomen, hips, and low back. She did have COVID in January and feels like this may have impacted her increase in symptoms - her activity level has gone down.  Fluid intake: Yes: 4-5 28oz bottles of water; tea in the morning/afternoon (caffeinated)  does not feel like caffeine impacts her symptoms and she has tracked before   Patient confirms identification and approves PT to assess pelvic floor and treatment Yes     PAIN:  Are you having pain? Yes dull ache on Rt side (SI area) - can alternate sides and sometimes in the front NPRS scale: 3/10     PRECAUTIONS: None   WEIGHT BEARING RESTRICTIONS No   FALLS:  Has patient fallen in last 6 months? No   LIVING ENVIRONMENT: Lives with: lives with their family Lives in: House/apartment   OCCUPATION: full time, seated   PLOF: Independent   PATIENT GOALS decrease urinary leaking   PERTINENT HISTORY:  G2P2 Sexual abuse: No   BOWEL MOVEMENT Pain with  bowel movement: No Type of bowel movement:Frequency 1-2/day and Strain Yes Fully empty rectum: No - last couple of months does not feel like she's emptying Leakage: No Pads: No Fiber supplement: No   URINATION Pain with urination: No Fully empty bladder: No - not always, no post-void dribbling Stream:  WNL Urgency: Yes: - Frequency: 2-3 hours Leakage: Urge to void, Coughing, Sneezing, Laughing, and Exercise - has to brace to prevent leaking - avoids jumping activities Pads: No   INTERCOURSE Pain with intercourse:  no pain vaginally, but some low back/hip pain depending on position Ability to have  vaginal penetration:  Yes: - Climax: yes     PREGNANCY Vaginal deliveries 2 (13 and 10) Tearing Yes: episiotomy and tear C-section deliveries 0 Currently pregnant No         OBJECTIVE:      07/07/21: COGNITION:            Overall cognitive status: Within functional limits for tasks assessed                          SENSATION:            Light touch: Appears intact            Proprioception: Appears intact   GAIT: WNL     PELVIC MMT:  2/5, endurance 8 seconds at 2/5 strength, repetitions 6           PALPATION:   General  minimal diastasis recti without distortion, core weakness with difficulty performing sit-up                 External Perineal Exam WNL                             Internal Pelvic Floor                          Restriction of urethra, worse with Rt mobilization; reported sharpness bil urethra                            TONE: WNL   PROLAPSE: Anterior vaginal wall laxity grade 2 Posterior vaginal wall laxity grade 1   TODAY'S TREATMENT 09/22/21 Neuromuscular re-education: Core facilitation: Quadruped asymmetrical hip lift with yoga block 5x bil Bear crawl holds 5 x 10 seconds Candle stick dippers 10x bil Push-ups with core facilitation 5x Deadbug 2 x 10  Self-care: Management of urgency/triggers, working through with distraction and breaking routine  Forcing use of techniques during moments of large urgency to help retrain bladder   TREATMENT 09/16/21 Self-care: Extensive discussion about how to be more compliant with HEP/exercises  Goal setting for the next week: getting up earlier to work out; something other than treadmill 2x this week - trying a circuit work out that we made for these two days (sumo squat with bicep curl, deadlift with row, squat press 10x each for 3-4 rounds) Various modes of working out: treadmill weight training, body weight, outdoor, [employer]  TREATMENT 09/10/21 Self-care: Discussed vaginal pH and importance  of maintaining - she regularly uses boric acid suppositories; she was encouraged to talk with MD about possible benefit of use of estrogen/testosterone cream; encouraged to try v-magic - she states that she is already using Lubricant options Increasing fiber due to loose frequent stools - consider stopping 2  stool softeners a day due to soft and frequent bowel movements HEP questions answered     PATIENT EDUCATION:  Education details: See above self-care Person educated: Patient Education method: Explanation, Demonstration, Tactile cues, Verbal cues, and Handouts Education comprehension: verbalized understanding     HOME EXERCISE PROGRAM: [email or web address]   ASSESSMENT:   CLINICAL IMPRESSION: Discussed urgency management techniques at length and benefit of distraction and these techniques during the episodes of urgency in order to help manage and retrain bladder. She was able to progress circuit training to include body weight core exercises and we found a circuit that she feels comfortable performing. She reported feeling comfortable incorporating this into her routine at least 1x/week. She will benefit from skilled PT intervention in order to reduce urinary urgency/incontinence, decrease pelvic/low back pain, and improve QOL.      OBJECTIVE IMPAIRMENTS decreased activity tolerance, decreased coordination, decreased endurance, decreased mobility, decreased ROM, decreased strength, increased muscle spasms, impaired flexibility, postural dysfunction, and pain.    ACTIVITY LIMITATIONS  community activities, exercise, playing with kids .    PERSONAL FACTORS 1 comorbidity: G2P2  are also affecting patient's functional outcome.      REHAB POTENTIAL: Good   CLINICAL DECISION MAKING: Stable/uncomplicated   EVALUATION COMPLEXITY: Low     GOALS: Goals reviewed with patient? Yes   SHORT TERM GOALS: Target date: 08/04/2021   Pt will be independent with HEP.    Baseline: Goal status: MET  08/12/21   2.  Pt will be able to correctly perform diaphragmatic breathing and appropriate pressure management in order to prevent worsening vaginal wall laxity and improve pelvic floor A/ROM.    Baseline:  Goal status: MET 08/12/21   3.  Pt will be able to teach back and utilize urge suppression technique in order to help reduce number of trips to the bathroom.     Baseline:  Goal status: MET 08/12/21     LONG TERM GOALS: Target date: 09/29/2021 updated 09/22/21   Pt will be independent with advanced HEP.    Baseline:  Goal status: IN PROGRESS 09/22/21   2.  Pt will demonstrate normal pelvic floor muscle tone and A/ROM, able to achieve 4/5 strength with contractions and 10 sec endurance, in order to provide appropriate lumbopelvic support in functional activities.    Baseline:  Goal status: IN PROGRESS 09/22/21   3.  Pt will demonstrate increase in all impaired hip strength by 2 muscle grades in order to demonstrate improved lumbopelvic support and increase functional ability.    Baseline:  Goal status: IN PROGRESS 09/22/21   4.  Pt will report no episodes of urinary incontinence in order to improve confidence in community activities, exercise, and playing with children.      Baseline: no episodes of urinary/fecal incontinence in last 2 weeks.  Goal status: IN PROGRESS 09/22/21       PLAN: PT FREQUENCY: 1x/week   PT DURATION: 12 weeks   PLANNED INTERVENTIONS: Therapeutic exercises, Therapeutic activity, Neuromuscular re-education, Balance training, Gait training, Patient/Family education, Joint mobilization, Dry Needling, Biofeedback, and Manual therapy   PLAN FOR NEXT SESSION: Progress body weight training. Come up with several simple circuits to incorporate into weekly routine. Address goals.    Heather Roberts, PT, DPT07/17/231:26 PM

## 2021-09-29 ENCOUNTER — Ambulatory Visit: Payer: PRIVATE HEALTH INSURANCE

## 2021-10-06 ENCOUNTER — Ambulatory Visit: Payer: PRIVATE HEALTH INSURANCE

## 2021-10-06 DIAGNOSIS — M6281 Muscle weakness (generalized): Secondary | ICD-10-CM | POA: Diagnosis not present

## 2021-10-06 DIAGNOSIS — R279 Unspecified lack of coordination: Secondary | ICD-10-CM

## 2021-10-06 DIAGNOSIS — M62838 Other muscle spasm: Secondary | ICD-10-CM

## 2021-10-06 NOTE — Therapy (Signed)
OUTPATIENT PHYSICAL THERAPY TREATMENT NOTE   Patient Name: Alyssa Vazquez MRN: [id number] DOB:1980/04/12, 41 y.o., female Today's Date: 10/06/2021  PCP: Jilda Panda, MD REFERRING PROVIDER: Delsa Bern, MD  END OF SESSION:   PT End of Session - 10/06/21 1232     Visit Number 10    Authorization Type [employer]    PT Start Time 4166    PT Stop Time 1310    PT Time Calculation (min) 40 min    Activity Tolerance Patient tolerated treatment well    Behavior During Therapy [facility] for tasks assessed/performed                   Past Medical History:  Diagnosis Date   History of abnormal Pap smear 02/17/2012   Abnormal in 1997; repeats have been normal   IBS (irritable bowel syndrome)    Past Surgical History:  Procedure Laterality Date   WISDOM TOOTH EXTRACTION  2001   Patient Active Problem List   Diagnosis Date Noted   History of abnormal Pap smear 02/17/2012   History of IBS 02/17/2012   Normal pregnancy 09/13/2010    REFERRING DIAG: M62.9 (ICD-10-CM) - Disorder of muscle, unspecified  THERAPY DIAG:  Muscle weakness (generalized)  Unspecified lack of coordination  Other muscle spasm  PERTINENT HISTORY: G2P2  PRECAUTIONS: NA  SUBJECTIVE: Pt states that she is finally feeling like she is going places with her treatment intervention again. She feels like she has all the tools she needs to make progress and will return if she needs to next year. She is feeling more motivated. She has been incorporating circuits into weekly routine. She states that she has not had any episodes of leaking since last being here. She has been working techniques on decreasing urgency.   PAIN:  Are you having pain? Yes: NPRS scale: 0/10 Pain location: Lt low back Pain description: digging Aggravating factors: rolling in bed Relieving factors: lying still   SUBJECTIVE STATEMENT: Pt states that she has struggled with pelvic floor for several years at this point and has been  for treatment several times. She feels like her biggest issues is urinary incontinence that has been getting worse over the last couple of months. She will go several days without issues and then leak 4x in one day without any known difference. She tries stopping/breathing and holding without improvement in urgency. She also reports pain surrounding lower core with abdomen, hips, and low back. She did have COVID in [place] and feels like this may have impacted her increase in symptoms - her activity level has gone down.  Fluid intake: Yes: 4-5 28oz bottles of water; tea in the morning/afternoon (caffeinated)  does not feel like caffeine impacts her symptoms and she has tracked before   Patient confirms identification and approves PT to assess pelvic floor and treatment Yes     PAIN:  Are you having pain? Yes dull ache on Rt side (SI area) - can alternate sides and sometimes in the front NPRS scale: 3/10     PRECAUTIONS: None   WEIGHT BEARING RESTRICTIONS No   FALLS:  Has patient fallen in last 6 months? No   LIVING ENVIRONMENT: Lives with: lives with their family Lives in: House/apartment   OCCUPATION: full time, seated   PLOF: Independent   PATIENT GOALS decrease urinary leaking   PERTINENT HISTORY:  G2P2 Sexual abuse: No   BOWEL MOVEMENT Pain with bowel movement: No Type of bowel movement:Frequency 1-2/day and Strain Yes Fully empty  rectum: No - last couple of months does not feel like she's emptying Leakage: No Pads: No Fiber supplement: No   URINATION Pain with urination: No Fully empty bladder: No - not always, no post-void dribbling Stream:  WNL Urgency: Yes: - Frequency: 2-3 hours Leakage: Urge to void, Coughing, Sneezing, Laughing, and Exercise - has to brace to prevent leaking - avoids jumping activities Pads: No   INTERCOURSE Pain with intercourse:  no pain vaginally, but some low back/hip pain depending on position Ability to have vaginal penetration:   Yes: - Climax: yes     PREGNANCY Vaginal deliveries 2 (13 and 10) Tearing Yes: episiotomy and tear C-section deliveries 0 Currently pregnant No         OBJECTIVE 10/06/21: Bil hip strength grossly 4+/5 with all motions      07/07/21: COGNITION:            Overall cognitive status: Within functional limits for tasks assessed                          SENSATION:            Light touch: Appears intact            Proprioception: Appears intact   GAIT: WNL     PELVIC MMT:  2/5, endurance 8 seconds at 2/5 strength, repetitions 6           PALPATION:   General  minimal diastasis recti without distortion, core weakness with difficulty performing sit-up                 External Perineal Exam WNL                             Internal Pelvic Floor                          Restriction of urethra, worse with Rt mobilization; reported sharpness bil urethra                            TONE: WNL   PROLAPSE: Anterior vaginal wall laxity grade 2 Posterior vaginal wall laxity grade 1   TODAY'S TREATMENT 10/06/21 Neuromuscular re-education: Core facilitation: Side plank 30 sec bil Side plank hip dips 10x bil Side plank clam shell 5x bil Side plank hip abduction 5x bil Side plank open book 5x bil Self-care: HEP review Postural modification Not clenching/holding postures for long periods  TREATMENT 09/22/21 Neuromuscular re-education: Core facilitation: Quadruped asymmetrical hip lift with yoga block 5x bil Bear crawl holds 5 x 10 seconds Candle stick dippers 10x bil Push-ups with core facilitation 5x Deadbug 2 x 10  Self-care: Management of urgency/triggers, working through with distraction and breaking routine  Forcing use of techniques during moments of large urgency to help retrain bladder   TREATMENT 09/16/21 Self-care: Extensive discussion about how to be more compliant with HEP/exercises  Goal setting for the next week: getting up earlier to work out; something  other than treadmill 2x this week - trying a circuit work out that we made for these two days (sumo squat with bicep curl, deadlift with row, squat press 10x each for 3-4 rounds) Various modes of working out: treadmill weight training, body weight, outdoor, [employer]    PATIENT EDUCATION:  Education details: See above self-care Person educated: Patient Education  method: Explanation, Demonstration, Tactile cues, Verbal cues, and Handouts Education comprehension: verbalized understanding     HOME EXERCISE PROGRAM: [email or web address]   ASSESSMENT:   CLINICAL IMPRESSION: Pt has seen slow progress in feeling better while in pelvic floor PT; however, much of this has been from not being able to dedicate time to HEP and lifestyle modifications. Over the last several weeks, she has been able to incorporate more of these things and is starting to feel better, notice good progress, and feel more motivated. She has met goals for being PT and states that she feels like she has tools to continue making progress. We modified body weight circuit today due to not liking one of the exercises to help with compliance. Due to progress and independence with HEP, she is prepared to D/C skilled PT intervention; she was encouraged to call with any questions or concerns.      OBJECTIVE IMPAIRMENTS decreased activity tolerance, decreased coordination, decreased endurance, decreased mobility, decreased ROM, decreased strength, increased muscle spasms, impaired flexibility, postural dysfunction, and pain.    ACTIVITY LIMITATIONS  community activities, exercise, playing with kids .    PERSONAL FACTORS 1 comorbidity: G2P2  are also affecting patient's functional outcome.      REHAB POTENTIAL: Good   CLINICAL DECISION MAKING: Stable/uncomplicated   EVALUATION COMPLEXITY: Low     GOALS: Goals reviewed with patient? Yes   SHORT TERM GOALS: Target date: 08/04/2021 - updated 10/06/21   Pt will be independent with HEP.     Baseline: Goal status: MET 08/12/21   2.  Pt will be able to correctly perform diaphragmatic breathing and appropriate pressure management in order to prevent worsening vaginal wall laxity and improve pelvic floor A/ROM.    Baseline:  Goal status: MET 08/12/21   3.  Pt will be able to teach back and utilize urge suppression technique in order to help reduce number of trips to the bathroom.     Baseline:  Goal status: MET 08/12/21     LONG TERM GOALS: Target date: 09/29/2021 updated 10/06/21   Pt will be independent with advanced HEP.    Baseline:  Goal status: MET 10/06/21   2.  Pt will demonstrate normal pelvic floor muscle tone and A/ROM, able to achieve 4/5 strength with contractions and 10 sec endurance, in order to provide appropriate lumbopelvic support in functional activities.    Baseline: No formal assessment today due to pt not feeling necessary; believe we would see good improvements based upon symptom reduction Goal status: MET 10/06/21   3.  Pt will demonstrate increase in all impaired hip strength by 2 muscle grades in order to demonstrate improved lumbopelvic support and increase functional ability.    Baseline:  Goal status: MET 10/06/21   4.  Pt will report no episodes of urinary incontinence in order to improve confidence in community activities, exercise, and playing with children.      Baseline: no episodes of urinary/fecal incontinence in last 2 weeks.  Goal status: MET 10/06/21       PLAN: PT FREQUENCY: NA   PT DURATION: NA   PLANNED INTERVENTIONS: D/C   PLAN FOR NEXT SESSION: D/C   PHYSICAL THERAPY DISCHARGE SUMMARY  Visits from Start of Care: 10  Current functional level related to goals / functional outcomes: Independent`   Remaining deficits: See above   Education / Equipment: HEP   Patient agrees to discharge. Patient goals were met. Patient is being discharged due to meeting the stated rehab goals.  Heather Roberts, PT,  DPT07/31/231:13 PM

## 2023-05-10 ENCOUNTER — Other Ambulatory Visit: Payer: Self-pay | Admitting: Obstetrics and Gynecology

## 2023-05-10 DIAGNOSIS — Z1231 Encounter for screening mammogram for malignant neoplasm of breast: Secondary | ICD-10-CM

## 2023-06-28 ENCOUNTER — Ambulatory Visit
Admission: RE | Admit: 2023-06-28 | Discharge: 2023-06-28 | Disposition: A | Discharge status: Home / Self Care | Payer: PRIVATE HEALTH INSURANCE | Source: Ambulatory Visit | Attending: Obstetrics and Gynecology | Admitting: Obstetrics and Gynecology

## 2023-06-28 DIAGNOSIS — Z1231 Encounter for screening mammogram for malignant neoplasm of breast: Secondary | ICD-10-CM
